# Patient Record
Sex: Female | Born: 2000
Health system: Southern US, Community
[De-identification: ages and names within clinical notes are randomized; demographics above are authoritative.]

## PROBLEM LIST (undated history)

## (undated) DIAGNOSIS — I471 Supraventricular tachycardia, unspecified: Secondary | ICD-10-CM

## (undated) DIAGNOSIS — F32A Depression, unspecified: Secondary | ICD-10-CM

## (undated) DIAGNOSIS — M722 Plantar fascial fibromatosis: Secondary | ICD-10-CM

## (undated) DIAGNOSIS — I498 Other specified cardiac arrhythmias: Secondary | ICD-10-CM

## (undated) DIAGNOSIS — G90A Postural orthostatic tachycardia syndrome (POTS): Secondary | ICD-10-CM

## (undated) DIAGNOSIS — M2550 Pain in unspecified joint: Secondary | ICD-10-CM

## (undated) DIAGNOSIS — L503 Dermatographic urticaria: Secondary | ICD-10-CM

## (undated) DIAGNOSIS — J302 Other seasonal allergic rhinitis: Secondary | ICD-10-CM

## (undated) DIAGNOSIS — F419 Anxiety disorder, unspecified: Secondary | ICD-10-CM

## (undated) HISTORY — DX: Dermatographic urticaria: L50.3

## (undated) HISTORY — DX: Depression, unspecified: F32.A

## (undated) HISTORY — DX: Postural orthostatic tachycardia syndrome (POTS): G90.A

## (undated) HISTORY — DX: Supraventricular tachycardia, unspecified: I47.10

## (undated) HISTORY — DX: Supraventricular tachycardia: I47.1

## (undated) HISTORY — DX: Pain in unspecified joint: M25.50

## (undated) HISTORY — PX: WISDOM TOOTH EXTRACTION: SHX21

## (undated) HISTORY — DX: Other specified cardiac arrhythmias: I49.8

## (undated) HISTORY — DX: Anxiety disorder, unspecified: F41.9

---
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
## 2019-01-04 MED FILL — MARLISSA-28 TABLET: 0.15-30 | 63 days supply | Qty: 84 | Fill #1

## 2019-01-04 MED FILL — MOMETASONE FUROATE 50 MCG S: 50 | 60 days supply | Qty: 17 | Fill #1

## 2019-01-05 DIAGNOSIS — Z6825 Body mass index (BMI) 25.0-25.9, adult: Secondary | ICD-10-CM | POA: Diagnosis not present

## 2019-01-05 DIAGNOSIS — N944 Primary dysmenorrhea: Secondary | ICD-10-CM | POA: Diagnosis not present

## 2019-01-05 DIAGNOSIS — Z130 Encounter for screening for diseases of the blood and blood-forming organs and certain disorders involving the immune mechanism: Secondary | ICD-10-CM | POA: Diagnosis not present

## 2019-01-05 DIAGNOSIS — Z01419 Encounter for gynecological examination (general) (routine) without abnormal findings: Secondary | ICD-10-CM | POA: Diagnosis not present

## 2019-01-11 ENCOUNTER — Encounter: Payer: Self-pay | Admitting: Psychiatry

## 2019-01-11 ENCOUNTER — Ambulatory Visit (INDEPENDENT_AMBULATORY_CARE_PROVIDER_SITE_OTHER): Payer: 59 | Admitting: Psychiatry

## 2019-01-11 VITALS — Ht 66.5 in | Wt 154.0 lb

## 2019-01-11 DIAGNOSIS — F900 Attention-deficit hyperactivity disorder, predominantly inattentive type: Secondary | ICD-10-CM

## 2019-01-11 DIAGNOSIS — F411 Generalized anxiety disorder: Secondary | ICD-10-CM

## 2019-01-11 DIAGNOSIS — F812 Mathematics disorder: Secondary | ICD-10-CM | POA: Diagnosis not present

## 2019-01-11 DIAGNOSIS — F341 Dysthymic disorder: Secondary | ICD-10-CM | POA: Diagnosis not present

## 2019-01-11 MED ORDER — ESCITALOPRAM OXALATE 10 MG PO TABS: 10.0000 mg | ORAL_TABLET | Freq: Every day | ORAL | 1 refills | Status: DC

## 2019-01-11 MED ORDER — BUPROPION HCL ER (XL) 150 MG PO TB24: 150.0000 mg | ORAL_TABLET | Freq: Every day | ORAL | 1 refills | Status: DC

## 2019-01-11 MED FILL — ESCITALOPRAM 10 MG TABLET: 10 | 90 days supply | Qty: 90 | Fill #0

## 2019-01-11 NOTE — Progress Notes (Signed)
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  She did well at [employer] first semester losing 2 pounds making a few good friends but many acquaintances, though not being particularly social.  She states the academics were hard but she likely did okay with grades still pending from finals and papers.  She takes Lexapro 10 mg every night but forgets Wellbutrin 150 mg XL some mornings without adverse effects.  She is less consistent with her Adderall 20 mg XR and Adderall 5 mg IR, not taking them at all on her break from college.  [place] registry documents last Adderall IR on 11/03/2018 at XR on 09/25/2018.  She could not receive her allergy shots at college.  She is driving adequately on return home even off the Adderall.  She has no mania, suicidality, psychosis or delirium.  Depression  The patient presents withdepression as a chronicproblem.The current episode started more than 2 years ago by 12/31/2016. The onset quality was gradual. The problem occurs every few days.The problem has been gradually improvingsince onset.Associated symptoms include decreased concentration, social withdrawal,and decreased interest. Associated symptoms include no helplessness,no hopelessness,not irritable,no restlessness,no appetite change,no myalgias,no headaches,not sad, nofatigue, noinsomniaand no suicidal ideas.The symptoms are aggravated by work stress, medication,  social issues and family issues.Past treatments include SSRIs - Selective serotonin reuptake inhibitors, other medications and psychotherapy.Compliance with treatment is good.Past compliance problems include difficulty with treatment plan and medication issues.Previous treatment provided moderaterelief.Risk factors include a change in medication usage/dosage, family history of mental illness, family history, major life event and stress. Past medical history includes anxiety,depressionand mental health disorder. Pertinent negatives include no chronic pain,no thyroid problem,no physical disability,no recent psychiatric admission,no bipolar disorder,no eating disorder,no obsessive-compulsive disorder,no post-traumatic stress disorder,no schizophrenia,no suicide attemptsand no head trauma.  Individual Medical History/ Review of Systems: Changes? :Yes with last allergy visit registered 08/14/2018 likely for HST, also off Singulair since then though she continues BCP for dysmenorrhea and likely spironolactone for acne.  Allergies: Patient has no known allergies.  Current Medications:  Current Outpatient Medications:  .  ADDERALL XR 20 MG 24 hr capsule, TAKE 1 CAPSULE BY MOUTH DAILY AFTER BREAKFAST., Disp: 90 capsule, Rfl: 0 .  amphetamine-dextroamphetamine (ADDERALL) 5 MG tablet, Take 1 tablet (5 mg total) by mouth 2 (two) times daily with a meal., Disp: 180 tablet, Rfl: 0 .  buPROPion (WELLBUTRIN XL) 150 MG 24 hr tablet, Take 1 tablet (150 mg total) by mouth daily after breakfast., Disp: 90 tablet, Rfl: 1 .  cetirizine (ZYRTEC) 5 MG chewable tablet, Chew 5 mg by mouth daily., Disp: , Rfl:  .  escitalopram (LEXAPRO) 10 MG tablet, Take 1 tablet (10 mg total) by mouth at bedtime., Disp: 90 tablet, Rfl: 1 .  montelukast (SINGULAIR) 4 MG chewable tablet, Chew 4 mg by mouth at bedtime., Disp: , Rfl:  .  predniSONE (STERAPRED UNI-PAK 21 TAB) 5 MG (21) TBPK tablet, 6tabs in am on tues,  5tabs in am on wed , 4 on thurs , 3 on fri, 2 tabs on  sat and 1 on sun., Disp: 21 tablet, Rfl: 1   Medication Side Effects: none  Family Medical/ Social History: Changes? No  MENTAL HEALTH EXAM:  Height 5' 6.5" (1.689 m), weight 154 lb (69.9 kg).Body mass index is 24.48 kg/m.  As not present here today  General Appearance: N/A  Eye Contact:  N/A  Speech:  Clear and Coherent, Normal Rate and Talkative  Volume:  Decreased  Mood:  Anxious, Dysphoric and Euthymic  Affect:  Congruent, Depressed, Inappropriate, Full Range and Anxious  Thought Process:  Coherent, Goal Directed, Irrelevant, Linear and Descriptions of Associations: Tangential  Orientation:  Full (Time, Place, and Person)  Thought Content: Rumination and Tangential   Suicidal Thoughts:  No  Homicidal Thoughts:  No  Memory:  Immediate;   Good Remote;   Good  Judgement:  Good  Insight:  Fair  Psychomotor Activity:  Normal and Mannerisms  Concentration:  Concentration: Fair and Attention Span: Fair  Recall:  Good  Fund of Knowledge: Good  Language: Good  Assets:  Leisure Time Resilience Vocational/Educational  ADL's:  Intact  Cognition: WNL  Prognosis:  Good    DIAGNOSES:    ICD-10-CM   1. Generalized anxiety disorder  F41.1 buPROPion (WELLBUTRIN XL) 150 MG 24 hr tablet    escitalopram (LEXAPRO) 10 MG tablet  2. Attention deficit hyperactivity disorder (ADHD), predominantly inattentive type  F90.0 buPROPion (WELLBUTRIN XL) 150 MG 24 hr tablet  3. Persistent depressive disorder with atypical features, currently mild  F34.1 escitalopram (LEXAPRO) 10 MG tablet  4. 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  Symptom treatment matching for medications can be simultaneously reviewed for more consistency with Wellbutrin like Lexapro with the as needed element of treatment to be the Adderall XR and IR.  She states mother sends her medications from [facility] needing eScription updates locally from mother.  She is E scribed Wellbutrin 150 mg XL every morning sent as #90 with 1 refill to [facility] outpatient pharmacy for anxiety, depression, and ADHD.  Lexapro is E scribed 10 mg every bedtime sent as #90 with 1 refill for anxiety and depression to [facility] outpatient pharmacy.  She has current supply of Adderall 20 mg XR every morning and 5 mg IR twice daily as directed receiving 90-day supplies to [facility] outpatient though having adequate supply currently to have mother or pharmacy notify office when next supply needed and return here in 6 months for follow-up.  Virtual Visit via Video Note  I connected with Kaesha Kirsch Worley on 01/11/19 at  1:00 PM EST by a video enabled telemedicine application and verified that I am speaking with the correct person using two identifiers.  Location: Patient: Individually at parents' residence by audio and video camera Provider: [facility]   I discussed the limitations of evaluation and management by telemedicine and the availability of in person appointments. The patient expressed understanding and agreed to proceed.  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  She  did well at [employer] first semester losing 2 pounds making a few good friends but many acquaintances, though not being particularly  social.  She states the academics were hard but she likely did okay with grades   Observations/Objective: Mood:  Anxious, Dysphoric and Euthymic  Affect:  Congruent, Depressed, Inappropriate, Full Range and Anxious  Thought Process:  Coherent, Goal Directed, Irrelevant, Linear and Descriptions of Associations: Tangential  Orientation:  Full (Time, Place, and Person)  Thought Content: Rumination and Tangential    Assessment and Plan:  Psychosupportive psychoeducation updates strengths from adaptations at [place]state college with paramount social and activity goals for next semester as successful structure is being established to be best implemented with ongoing encouragement and support such as she received from mother in high school, addressing social skills, frustration management, and solution focused opportunities.  Symptom treatment matching for medications can be simultaneously reviewed for more consistency with Wellbutrin like Lexapro with the as needed element of treatment to be the Adderall XR and IR.  She states [facility] needing eScription updates locally from mother.  She is E scribed Wellbutrin 150 mg XL every morning sent as #90 with 1 refill to [facility] outpatient pharmacy for anxiety, depression, and ADHD.  Lexapro is E scribed 10 mg every bedtime sent as #90 with 1 refill for anxiety and depression to [facility] pharmacy.  She has current supply of Adderall 20 mg XR every morning and 5 mg IR twice daily as directed   Follow Up Instructions: To have mother or pharmacy notify office when next supply needed and return here in 6 months for follow-up.   I discussed the assessment and treatment plan with the patient. The patient was provided an opportunity to ask questions and all were answered. The patient agreed with the plan and demonstrated an understanding of the instructions.   The patient was advised to call back or seek an in-person  evaluation if the symptoms worsen or if the condition fails to improve as anticipated.  I provided 15 minutes of non-face-to-face time during this encounter. [employer] meeting #[phone] Meeting password:  [email or web address]  Chauncey MannGlenn E Joylyn Duggin, MD  Chauncey MannGlenn E Damyan Corne, MD

## 2019-01-28 ENCOUNTER — Other Ambulatory Visit: Payer: Self-pay | Admitting: Psychiatry

## 2019-01-28 DIAGNOSIS — F900 Attention-deficit hyperactivity disorder, predominantly inattentive type: Secondary | ICD-10-CM

## 2019-01-28 NOTE — Telephone Encounter (Signed)
Next apt 06/17/2019 

## 2019-01-28 NOTE — Telephone Encounter (Signed)
Recent appointment confirmed necessity medically with no contraindication sending Adderall 20 mg XR every morning #90 with no refill to [facility] outpatient pharmacy as patient goes to [employer]

## 2019-01-29 MED FILL — ADDERALL XR 20 MG CAP SA: 20 | 90 days supply | Qty: 90 | Fill #0

## 2019-02-01 DIAGNOSIS — R519 Headache, unspecified: Secondary | ICD-10-CM | POA: Diagnosis not present

## 2019-02-01 DIAGNOSIS — Z049 Encounter for examination and observation for unspecified reason: Secondary | ICD-10-CM | POA: Diagnosis not present

## 2019-02-01 DIAGNOSIS — Z79899 Other long term (current) drug therapy: Secondary | ICD-10-CM | POA: Diagnosis not present

## 2019-02-01 DIAGNOSIS — G43719 Chronic migraine without aura, intractable, without status migrainosus: Secondary | ICD-10-CM | POA: Diagnosis not present

## 2019-02-01 MED FILL — BACLOFEN 10 MG TABS: 10 | 30 days supply | Qty: 20 | Fill #0

## 2019-02-09 DIAGNOSIS — G43719 Chronic migraine without aura, intractable, without status migrainosus: Secondary | ICD-10-CM | POA: Diagnosis not present

## 2019-02-09 MED FILL — ZONISAMIDE 25 MG CAPSULE: 25 | 30 days supply | Qty: 120 | Fill #0

## 2019-02-16 DIAGNOSIS — M542 Cervicalgia: Secondary | ICD-10-CM | POA: Diagnosis not present

## 2019-02-16 DIAGNOSIS — G43719 Chronic migraine without aura, intractable, without status migrainosus: Secondary | ICD-10-CM | POA: Diagnosis not present

## 2019-02-16 DIAGNOSIS — M791 Myalgia, unspecified site: Secondary | ICD-10-CM | POA: Diagnosis not present

## 2019-02-23 MED FILL — buPROPion HCL ER (XL) 150 M: 150 | 90 days supply | Qty: 90 | Fill #0

## 2019-03-02 DIAGNOSIS — G43719 Chronic migraine without aura, intractable, without status migrainosus: Secondary | ICD-10-CM | POA: Diagnosis not present

## 2019-03-02 DIAGNOSIS — M791 Myalgia, unspecified site: Secondary | ICD-10-CM | POA: Diagnosis not present

## 2019-03-02 DIAGNOSIS — M542 Cervicalgia: Secondary | ICD-10-CM | POA: Diagnosis not present

## 2019-03-04 MED FILL — buPROPion HCL ER (XL) 150 M: 150 | 90 days supply | Qty: 90 | Fill #0 | Status: TO

## 2019-03-04 MED FILL — buPROPion HCL ER (XL) 150 M: 150 | 90 days supply | Qty: 90 | Fill #0

## 2019-03-11 MED FILL — MARLISSA-28 TABLET: 0.15-30 | 63 days supply | Qty: 84 | Fill #0

## 2019-03-17 DIAGNOSIS — G43719 Chronic migraine without aura, intractable, without status migrainosus: Secondary | ICD-10-CM | POA: Diagnosis not present

## 2019-03-17 DIAGNOSIS — M542 Cervicalgia: Secondary | ICD-10-CM | POA: Diagnosis not present

## 2019-03-17 DIAGNOSIS — M791 Myalgia, unspecified site: Secondary | ICD-10-CM | POA: Diagnosis not present

## 2019-03-31 MED FILL — ZONISAMIDE 25 MG CAPSULE: 25 | 30 days supply | Qty: 120 | Fill #1

## 2019-04-06 DIAGNOSIS — G43719 Chronic migraine without aura, intractable, without status migrainosus: Secondary | ICD-10-CM | POA: Diagnosis not present

## 2019-04-06 DIAGNOSIS — M791 Myalgia, unspecified site: Secondary | ICD-10-CM | POA: Diagnosis not present

## 2019-04-06 DIAGNOSIS — M542 Cervicalgia: Secondary | ICD-10-CM | POA: Diagnosis not present

## 2019-04-15 ENCOUNTER — Ambulatory Visit: Payer: 59 | Attending: Internal Medicine

## 2019-04-15 DIAGNOSIS — Z23 Encounter for immunization: Secondary | ICD-10-CM

## 2019-04-15 NOTE — Progress Notes (Signed)
   Covid-19 Vaccination Clinic  Name:  GILDA ABBOUD    MRN: [id number] DOB: 2000-11-29  04/15/2019  Ms. Suchy was observed post Covid-19 immunization for 15 minutes without incident. She was provided with Vaccine Information Sheet and instruction to access the V-Safe system.   Ms. Cohill was instructed to call 911 with any severe reactions post vaccine: Marland Kitchen Difficulty breathing  . Swelling of face and throat  . A fast heartbeat  . A bad rash all over body  . Dizziness and weakness   Immunizations Administered    Name Date Dose VIS Date Route   Pfizer COVID-19 Vaccine 04/15/2019  1:00 PM 0.3 mL 01/01/2019 Intramuscular   Manufacturer: [employer], [employer]   Lot: [id number]   NDC: [address]-6

## 2019-04-26 MED FILL — ESCITALOPRAM 10 MG TABLET: 10 | 90 days supply | Qty: 90 | Fill #1

## 2019-04-29 DIAGNOSIS — M542 Cervicalgia: Secondary | ICD-10-CM | POA: Diagnosis not present

## 2019-04-29 DIAGNOSIS — G43719 Chronic migraine without aura, intractable, without status migrainosus: Secondary | ICD-10-CM | POA: Diagnosis not present

## 2019-04-29 DIAGNOSIS — M791 Myalgia, unspecified site: Secondary | ICD-10-CM | POA: Diagnosis not present

## 2019-05-10 MED FILL — ZONISAMIDE 25 MG CAPSULE: 25 | 30 days supply | Qty: 120 | Fill #0

## 2019-05-11 ENCOUNTER — Ambulatory Visit: Payer: 59 | Attending: Internal Medicine

## 2019-05-11 DIAGNOSIS — Z23 Encounter for immunization: Secondary | ICD-10-CM

## 2019-05-11 NOTE — Progress Notes (Signed)
   Covid-19 Vaccination Clinic  Name:  Rachel Little    MRN: [id number] DOB: Jun 25, 2000  05/11/2019  Rachel Little was observed post Covid-19 immunization for 15 minutes without incident. She was provided with Vaccine Information Sheet and instruction to access the V-Safe system.   Rachel Little was instructed to call 911 with any severe reactions post vaccine: Marland Kitchen Difficulty breathing  . Swelling of face and throat  . A fast heartbeat  . A bad rash all over body  . Dizziness and weakness   Immunizations Administered    Name Date Dose VIS Date Route   Pfizer COVID-19 Vaccine 05/11/2019 10:24 AM 0.3 mL 03/17/2018 Intramuscular   Manufacturer: [employer], [employer]   Lot: [id number]   NDC: [address]-1

## 2019-05-19 MED FILL — MARLISSA-28 TABLET: 0.15-30 | 63 days supply | Qty: 84 | Fill #1

## 2019-05-20 DIAGNOSIS — M542 Cervicalgia: Secondary | ICD-10-CM | POA: Diagnosis not present

## 2019-05-20 DIAGNOSIS — G43719 Chronic migraine without aura, intractable, without status migrainosus: Secondary | ICD-10-CM | POA: Diagnosis not present

## 2019-05-20 DIAGNOSIS — M791 Myalgia, unspecified site: Secondary | ICD-10-CM | POA: Diagnosis not present

## 2019-05-28 MED FILL — ZONISAMIDE 50 MG CAPSULE: 50 | 30 days supply | Qty: 90 | Fill #0

## 2019-06-12 DIAGNOSIS — F411 Generalized anxiety disorder: Secondary | ICD-10-CM | POA: Diagnosis not present

## 2019-06-12 DIAGNOSIS — F331 Major depressive disorder, recurrent, moderate: Secondary | ICD-10-CM | POA: Diagnosis not present

## 2019-06-12 DIAGNOSIS — F902 Attention-deficit hyperactivity disorder, combined type: Secondary | ICD-10-CM | POA: Diagnosis not present

## 2019-06-14 MED FILL — DESVENLAFAXINE SUC ER 25 MG: 25 | 90 days supply | Qty: 90 | Fill #0

## 2019-06-17 ENCOUNTER — Ambulatory Visit: Payer: 59 | Admitting: Psychiatry

## 2019-06-19 ENCOUNTER — Other Ambulatory Visit: Payer: Self-pay

## 2019-06-19 ENCOUNTER — Encounter (HOSPITAL_COMMUNITY): Payer: Self-pay | Admitting: Emergency Medicine

## 2019-06-19 ENCOUNTER — Ambulatory Visit (HOSPITAL_COMMUNITY)
Admission: EM | Admit: 2019-06-19 | Discharge: 2019-06-19 | Disposition: A | Discharge status: Home / Self Care | Payer: 59 | Attending: Emergency Medicine | Admitting: Emergency Medicine

## 2019-06-19 DIAGNOSIS — Z3202 Encounter for pregnancy test, result negative: Secondary | ICD-10-CM | POA: Diagnosis not present

## 2019-06-19 DIAGNOSIS — M545 Low back pain, unspecified: Secondary | ICD-10-CM

## 2019-06-19 DIAGNOSIS — N390 Urinary tract infection, site not specified: Secondary | ICD-10-CM | POA: Diagnosis not present

## 2019-06-19 LAB — POCT URINALYSIS DIP (DEVICE)
Bilirubin Urine: NEGATIVE
Glucose, UA: NEGATIVE mg/dL
Hgb urine dipstick: NEGATIVE
Ketones, ur: NEGATIVE mg/dL
Nitrite: NEGATIVE
Protein, ur: NEGATIVE mg/dL
Specific Gravity, Urine: 1.015 (ref 1.005–1.030)
Urobilinogen, UA: 0.2 mg/dL (ref 0.0–1.0)
pH: 6.5 (ref 5.0–8.0)

## 2019-06-19 LAB — POC URINE PREG, ED: Preg Test, Ur: NEGATIVE

## 2019-06-19 MED ORDER — NAPROXEN 500 MG PO TABS: 500.0000 mg | ORAL_TABLET | Freq: Two times a day (BID) | ORAL | 0 refills | Status: DC

## 2019-06-19 NOTE — ED Triage Notes (Signed)
Pt c/o uti sx including lower back pain and urinary incontence this morning when she woke up. Pt denies any vaginal discharge or vaginal bleeding. Pt denies painful urination.

## 2019-06-19 NOTE — Discharge Instructions (Addendum)
Continue to drink plenty of fluids Urine culture pending, initial test did not shhow infection/UTI Vaginal swab pending to screen for any infection causing odor Use naprosyn twice daily as needed for pain

## 2019-06-20 DIAGNOSIS — F4011 Social phobia, generalized: Secondary | ICD-10-CM | POA: Diagnosis not present

## 2019-06-20 DIAGNOSIS — F331 Major depressive disorder, recurrent, moderate: Secondary | ICD-10-CM | POA: Diagnosis not present

## 2019-06-20 LAB — URINE CULTURE: Culture: NO GROWTH

## 2019-06-20 NOTE — ED Provider Notes (Signed)
[facility]    CSN: [id number] Arrival date & time: 06/19/19  1616      History   Chief Complaint Chief Complaint  Patient presents with  . Urinary Tract Infection    HPI Rachel Little is a 19 y.o. female presenting today for evaluation of lower back pain.  Patient reports that over the past few weeks she has noticed that she has had foul-smelling urine.  In the past day she has developed pain in her lower back and side.  Reports last night she had an episode of urinary incontinence, but this has not persisted throughout the day.  She denies numbness or tingling.  She denies any dysuria, increased frequency or urgency.  She denies any vaginal discharge.  Denies being sexually active.  Denies any injury fall or increase in activity.  HPI  Past Medical History:  Diagnosis Date  . Seasonal allergic rhinitis     Patient Active Problem List   Diagnosis Date Noted  . Generalized anxiety disorder 10/21/2017  . Attention deficit hyperactivity disorder (ADHD), predominantly inattentive type 10/21/2017  . Persistent depressive disorder with atypical features, currently mild 10/21/2017  . 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 No pertinent surgical history.  OB History   No obstetric history on file.      Home Medications    Prior to Admission medications   Medication Sig Start Date End Date Taking? Authorizing Provider  ADDERALL XR 20 MG 24 hr capsule TAKE 1 CAPSULE BY MOUTH DAILY AFTER BREAKFAST. 01/28/19  Yes Chauncey Mann, MD  baclofen (LIORESAL) 10 MG tablet Take 10 mg by mouth as needed. 02/01/19  Yes [provider]  desvenlafaxine (PRISTIQ) 50 MG 24 hr tablet Take 50 mg by mouth daily.   Yes [provider]  MARLISSA 0.15-30 MG-MCG tablet Take 1 tablet by mouth daily. 05/19/19  Yes [provider]  spironolactone (ALDACTONE) 50 MG tablet Take 50 mg by mouth daily.   Yes  [provider]  montelukast (SINGULAIR) 4 MG chewable tablet Chew 4 mg by mouth at bedtime.  06/19/19 Yes [provider]  zonisamide (ZONEGRAN) 50 MG capsule Take 150 mg by mouth daily. 05/28/19 06/19/19 Yes [provider]  naproxen (NAPROSYN) 500 MG tablet Take 1 tablet (500 mg total) by mouth 2 (two) times daily. 06/19/19   Wieters, Hallie C, PA-C  buPROPion (WELLBUTRIN XL) 150 MG 24 hr tablet Take 1 tablet (150 mg total) by mouth daily after breakfast. 01/11/19 06/19/19  Chauncey Mann, MD  cetirizine (ZYRTEC) 5 MG chewable tablet Chew 5 mg by mouth daily.  06/19/19  [provider]  escitalopram (LEXAPRO) 10 MG tablet Take 1 tablet (10 mg total) by mouth at bedtime. 01/11/19 06/19/19  Chauncey Mann, MD    Family History No family history on file.  Social History Social History   Tobacco Use  . Smoking status: Never Smoker  . Smokeless tobacco: Never Used  Substance Use Topics  . Alcohol use: Not on file  . Drug use: Not on file     Allergies   Patient has no known allergies.   Review of Systems Review of Systems  Constitutional: Negative for fever.  Respiratory: Negative for shortness of breath.   Cardiovascular: Negative for chest pain.  Gastrointestinal: Negative for abdominal pain, diarrhea, nausea and vomiting.  Genitourinary: Positive for flank pain. Negative for dysuria, genital sores, hematuria, menstrual problem, vaginal bleeding, vaginal discharge and  vaginal pain.  Musculoskeletal: Positive for back pain.  Skin: Negative for rash.  Neurological: Negative for dizziness, light-headedness and headaches.     Physical Exam Triage Vital Signs ED Triage Vitals  Enc Vitals Group     BP 06/19/19 1639 128/76     Pulse Rate 06/19/19 1639 91     Resp 06/19/19 1639 16     Temp 06/19/19 1639 97.6 F (36.4 C)     Temp Source 06/19/19 1639 Oral     SpO2 06/19/19 1639 98 %     Weight --      Height --      Head Circumference --       Peak Flow --      Pain Score 06/19/19 1641 0     Pain Loc --      Pain Edu? --      Excl. in National? --    No data found.  Updated Vital Signs BP 128/76 (BP Location: Right Arm)   Pulse 91   Temp 97.6 F (36.4 C) (Oral)   Resp 16   SpO2 98%   Visual Acuity Right Eye Distance:   Left Eye Distance:   Bilateral Distance:    Right Eye Near:   Left Eye Near:    Bilateral Near:     Physical Exam Vitals and nursing note reviewed.  Constitutional:      Appearance: She is well-developed.     Comments: No acute distress  HENT:     Head: Normocephalic and atraumatic.     Nose: Nose normal.  Eyes:     Conjunctiva/sclera: Conjunctivae normal.  Cardiovascular:     Rate and Rhythm: Normal rate.  Pulmonary:     Effort: Pulmonary effort is normal. No respiratory distress.  Abdominal:     General: There is no distension.     Comments: Soft, nondistended, nontender to light and deep palpation throughout lower abdomen, mild tenderness to epigastrium.  Musculoskeletal:        General: Normal range of motion.     Cervical back: Neck supple.     Comments: Back: Nontender to palpation lumbar spine midline or bilateral lumbar musculature, weakly positive CVA tenderness on the left  Skin:    General: Skin is warm and dry.  Neurological:     Mental Status: She is alert and oriented to person, place, and time.      UC Treatments / Results  Labs (all labs ordered are listed, but only abnormal results are displayed) Labs Reviewed  POCT URINALYSIS DIP (DEVICE) - Abnormal; Notable for the following components:      Result Value   Leukocytes,Ua TRACE (*)    All other components within normal limits  URINE CULTURE  POC URINE PREG, ED  CERVICOVAGINAL ANCILLARY ONLY    EKG   Radiology No results found.  Procedures Procedures (including critical care time)  Medications Ordered in UC Medications - No data to display  Initial Impression / Assessment and Plan / UC Course  I  have reviewed the triage vital signs and the nursing notes.  Pertinent labs & imaging results that were available during my care of the patient were reviewed by me and considered in my medical decision making (see chart for details).     UA with trace leuks, will send for culture.  At this time less suggestive of UTI, discussed obtaining swab to screen for yeast/BV for possible cause of odor.  Less suspicious of STDs.  At this time  treating symptomatically and supportively, using Naprosyn as needed for pain with close monitoring.  Discussed strict return precautions. Patient verbalized understanding and is agreeable with plan.  Final Clinical Impressions(s) / UC Diagnoses   Final diagnoses:  Acute left-sided low back pain without sciatica     Discharge Instructions     Continue to drink plenty of fluids Urine culture pending, initial test did not shhow infection/UTI Vaginal swab pending to screen for any infection causing odor Use naprosyn twice daily as needed for pain     ED Prescriptions    Medication Sig Dispense Auth. Provider   naproxen (NAPROSYN) 500 MG tablet Take 1 tablet (500 mg total) by mouth 2 (two) times daily. 30 tablet Wieters, [place] C, PA-C     PDMP not reviewed this encounter.   Lew Dawes, PA-C 06/20/19 1042

## 2019-06-22 MED FILL — NAPROXEN 500 MG TABLET: 500 | 15 days supply | Qty: 30 | Fill #0

## 2019-06-23 LAB — CERVICOVAGINAL ANCILLARY ONLY
Bacterial Vaginitis (gardnerella): NEGATIVE
Candida Glabrata: NEGATIVE
Candida Vaginitis: NEGATIVE
Chlamydia: NEGATIVE
Comment: NEGATIVE
Comment: NEGATIVE
Comment: NEGATIVE
Comment: NEGATIVE
Comment: NEGATIVE
Comment: NORMAL
Neisseria Gonorrhea: NEGATIVE
Trichomonas: NEGATIVE

## 2019-07-01 DIAGNOSIS — G43719 Chronic migraine without aura, intractable, without status migrainosus: Secondary | ICD-10-CM | POA: Diagnosis not present

## 2019-07-05 DIAGNOSIS — F411 Generalized anxiety disorder: Secondary | ICD-10-CM | POA: Diagnosis not present

## 2019-07-12 DIAGNOSIS — F4011 Social phobia, generalized: Secondary | ICD-10-CM | POA: Diagnosis not present

## 2019-07-12 DIAGNOSIS — F331 Major depressive disorder, recurrent, moderate: Secondary | ICD-10-CM | POA: Diagnosis not present

## 2019-07-12 MED FILL — DESVENLAFAXINE SUC ER 100 M: 100 | 90 days supply | Qty: 90 | Fill #0

## 2019-07-14 MED FILL — MARLISSA-28 TABLET: 0.15-30 | 84 days supply | Qty: 112 | Fill #2

## 2019-07-16 MED FILL — SPIRONOLACTONE 50 MG TABS: 50 | 90 days supply | Qty: 180 | Fill #0

## 2019-08-02 MED FILL — BACLOFEN 10 MG TABS: 10 | 30 days supply | Qty: 20 | Fill #1

## 2019-08-02 MED FILL — ADDERALL XR 20 MG CAP SA: 20 | 90 days supply | Qty: 90 | Fill #0

## 2019-08-02 MED FILL — ZONISAMIDE 100 MG CAPSULE: 100 | 30 days supply | Qty: 60 | Fill #1

## 2019-08-14 DIAGNOSIS — F4011 Social phobia, generalized: Secondary | ICD-10-CM | POA: Diagnosis not present

## 2019-08-14 DIAGNOSIS — F331 Major depressive disorder, recurrent, moderate: Secondary | ICD-10-CM | POA: Diagnosis not present

## 2019-08-27 MED FILL — ZONISAMIDE 100 MG CAPSULE: 100 | 30 days supply | Qty: 60 | Fill #2

## 2019-09-17 DIAGNOSIS — Z20822 Contact with and (suspected) exposure to covid-19: Secondary | ICD-10-CM | POA: Diagnosis not present

## 2019-09-17 DIAGNOSIS — G510 Bell's palsy: Secondary | ICD-10-CM | POA: Diagnosis not present

## 2019-09-17 DIAGNOSIS — Z03818 Encounter for observation for suspected exposure to other biological agents ruled out: Secondary | ICD-10-CM | POA: Diagnosis not present

## 2019-09-30 DIAGNOSIS — N946 Dysmenorrhea, unspecified: Secondary | ICD-10-CM | POA: Diagnosis not present

## 2019-09-30 DIAGNOSIS — F329 Major depressive disorder, single episode, unspecified: Secondary | ICD-10-CM | POA: Diagnosis not present

## 2019-09-30 DIAGNOSIS — Z23 Encounter for immunization: Secondary | ICD-10-CM | POA: Diagnosis not present

## 2019-09-30 DIAGNOSIS — M6281 Muscle weakness (generalized): Secondary | ICD-10-CM | POA: Diagnosis not present

## 2019-09-30 DIAGNOSIS — G510 Bell's palsy: Secondary | ICD-10-CM | POA: Diagnosis not present

## 2019-09-30 DIAGNOSIS — G43909 Migraine, unspecified, not intractable, without status migrainosus: Secondary | ICD-10-CM | POA: Diagnosis not present

## 2019-09-30 DIAGNOSIS — Z793 Long term (current) use of hormonal contraceptives: Secondary | ICD-10-CM | POA: Diagnosis not present

## 2019-09-30 DIAGNOSIS — R6889 Other general symptoms and signs: Secondary | ICD-10-CM | POA: Diagnosis not present

## 2019-09-30 DIAGNOSIS — H04123 Dry eye syndrome of bilateral lacrimal glands: Secondary | ICD-10-CM | POA: Diagnosis not present

## 2019-09-30 DIAGNOSIS — H6122 Impacted cerumen, left ear: Secondary | ICD-10-CM | POA: Diagnosis not present

## 2019-09-30 DIAGNOSIS — R5383 Other fatigue: Secondary | ICD-10-CM | POA: Diagnosis not present

## 2019-10-01 DIAGNOSIS — Z0000 Encounter for general adult medical examination without abnormal findings: Secondary | ICD-10-CM | POA: Diagnosis not present

## 2019-10-04 MED FILL — DESVENLAFAXINE SUC ER 100 M: 100 | 90 days supply | Qty: 90 | Fill #0

## 2019-10-09 DIAGNOSIS — F331 Major depressive disorder, recurrent, moderate: Secondary | ICD-10-CM | POA: Diagnosis not present

## 2019-10-09 DIAGNOSIS — F4011 Social phobia, generalized: Secondary | ICD-10-CM | POA: Diagnosis not present

## 2019-10-12 DIAGNOSIS — Z0000 Encounter for general adult medical examination without abnormal findings: Secondary | ICD-10-CM | POA: Diagnosis not present

## 2019-10-12 DIAGNOSIS — H04123 Dry eye syndrome of bilateral lacrimal glands: Secondary | ICD-10-CM | POA: Diagnosis not present

## 2019-10-12 DIAGNOSIS — H5213 Myopia, bilateral: Secondary | ICD-10-CM | POA: Diagnosis not present

## 2019-10-12 DIAGNOSIS — R7309 Other abnormal glucose: Secondary | ICD-10-CM | POA: Diagnosis not present

## 2019-10-12 MED FILL — SPIRONOLACTONE 50 MG TABS: 50 | 90 days supply | Qty: 180 | Fill #1

## 2019-10-12 MED FILL — MARLISSA-28 TABLET: 0.15-30 | 84 days supply | Qty: 112 | Fill #3

## 2019-10-15 DIAGNOSIS — G43909 Migraine, unspecified, not intractable, without status migrainosus: Secondary | ICD-10-CM | POA: Diagnosis not present

## 2019-10-20 DIAGNOSIS — R5383 Other fatigue: Secondary | ICD-10-CM | POA: Diagnosis not present

## 2019-10-20 DIAGNOSIS — M6281 Muscle weakness (generalized): Secondary | ICD-10-CM | POA: Diagnosis not present

## 2019-10-20 DIAGNOSIS — F411 Generalized anxiety disorder: Secondary | ICD-10-CM | POA: Diagnosis not present

## 2019-10-20 DIAGNOSIS — Z8261 Family history of arthritis: Secondary | ICD-10-CM | POA: Diagnosis not present

## 2019-10-20 DIAGNOSIS — Z820 Family history of epilepsy and other diseases of the nervous system: Secondary | ICD-10-CM | POA: Diagnosis not present

## 2019-10-20 DIAGNOSIS — R768 Other specified abnormal immunological findings in serum: Secondary | ICD-10-CM | POA: Diagnosis not present

## 2019-10-20 DIAGNOSIS — G43909 Migraine, unspecified, not intractable, without status migrainosus: Secondary | ICD-10-CM | POA: Diagnosis not present

## 2019-10-20 DIAGNOSIS — F900 Attention-deficit hyperactivity disorder, predominantly inattentive type: Secondary | ICD-10-CM | POA: Diagnosis not present

## 2019-10-20 DIAGNOSIS — F329 Major depressive disorder, single episode, unspecified: Secondary | ICD-10-CM | POA: Diagnosis not present

## 2019-10-28 ENCOUNTER — Other Ambulatory Visit (HOSPITAL_COMMUNITY): Payer: Self-pay | Admitting: Specialist

## 2019-10-28 ENCOUNTER — Institutional Professional Consult (permissible substitution): Payer: 59 | Admitting: Internal Medicine

## 2019-10-28 DIAGNOSIS — G43719 Chronic migraine without aura, intractable, without status migrainosus: Secondary | ICD-10-CM | POA: Diagnosis not present

## 2019-10-29 ENCOUNTER — Ambulatory Visit: Payer: 59 | Admitting: Internal Medicine

## 2019-10-29 ENCOUNTER — Other Ambulatory Visit: Payer: Self-pay

## 2019-10-29 ENCOUNTER — Encounter: Payer: Self-pay | Admitting: Internal Medicine

## 2019-10-29 VITALS — BP 130/85 | HR 105 | Ht 66.0 in | Wt 131.6 lb

## 2019-10-29 DIAGNOSIS — I498 Other specified cardiac arrhythmias: Secondary | ICD-10-CM

## 2019-10-29 DIAGNOSIS — G90A Postural orthostatic tachycardia syndrome (POTS): Secondary | ICD-10-CM

## 2019-10-29 MED FILL — ZONISAMIDE 100 MG CAPSULE: 100 | 10 days supply | Qty: 20 | Fill #0

## 2019-10-29 NOTE — Patient Instructions (Signed)
Medication Instructions:  Your physician recommends that you continue on your current medications as directed. Please refer to the Current Medication list given to you today.  *If you need a refill on your cardiac medications before your next appointment, please call your pharmacy*   Lab Work: None ordered.  If you have labs (blood work) drawn today and your tests are completely normal, you will receive your results only by: Marland Kitchen MyChart Message (if you have MyChart) OR . A paper copy in the mail If you have any lab test that is abnormal or we need to change your treatment, we will call you to review the results.   Testing/Procedures: None ordered.    Follow-Up: At [facility], you and your health needs are our priority.  As part of our continuing mission to provide you with exceptional heart care, we have created designated Provider Care Teams.  These Care Teams include your primary Cardiologist (physician) and Advanced Practice Providers (APPs -  Physician Assistants and Nurse Practitioners) who all work together to provide you with the care you need, when you need it.  We recommend signing up for the patient portal called "MyChart".  Sign up information is provided on this After Visit Summary.  MyChart is used to connect with patients for Virtual Visits (Telemedicine).  Patients are able to view lab/test results, encounter notes, upcoming appointments, etc.  Non-urgent messages can be sent to your provider as well.   To learn more about what you can do with MyChart, go to [email or web address].    Your next appointment:  Virtual 01/13/2020 at 345 with Dr Graciela Husbands   Other Instructions Compression wear as discussed by Dr Graciela Husbands  Increase Fluid Intake  Monitor Salt intake d/t elevated blood pressure

## 2019-10-29 NOTE — Progress Notes (Signed)
ELECTROPHYSIOLOGY OFFICE NOTE  Patient ID: Rachel Little, MRN: [id number], DOB/AGE: December 16, 2000 19 y.o. Admit date: (Not on file) Date of Consult: 10/29/2019  Primary Physician: Patient, No Pcp Per Primary Cardiologist: none       HPI Rachel Little is a 19 y.o. female seen at the request of her father because of orthostatic tachycardia  She has a hx of abrupt onset/offset tachypalpitations which apparently are not that problematic--  She has hx of LH in the shower, in jacuzzis and standing in the heat as well as following physical exertionM (climbing 2 flights of stairs at home)  which dates back years  Two episodes of prodromally recognizable syncope around the time of middle school but with recurrent prodromal presyncope over recent years  Also has noted violaceous discoloration in showers   Not with standing this she has been able to hike with the rest of the family  About two months ago her symptoms became more severe, with arthralgias, headaches, increasing fatigue and some tachypalpitations. No specific triggers, no viral illness  Her father appreciated orthostatic tachycardia relieved by recumbency  Her diet is deplete of salt and fluid  Her menses have been heavy but is now on OCRx which has spaced them out to q3 months, still heavy. Not sure if symptoms are worse during period or not  Takes aldactone for acne (See Below)   Does not use alcohol or mja.         Past Medical History:  Diagnosis Date  . Anxiety and depression   . Arthralgias   . Dermatographism   . POTS (postural orthostatic tachycardia syndrome)   . Seasonal allergic rhinitis   . 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   Home Meds: Current Meds  Medication Sig  . ADDERALL XR 20 MG 24 hr capsule TAKE 1 CAPSULE BY MOUTH DAILY AFTER BREAKFAST.  . baclofen (LIORESAL) 10 MG  tablet Take 10 mg by mouth as needed.   Medication list is incomplete.  On pristiq etc  Allergies: No Known Allergies  Social History   Socioeconomic History  . Marital status: Single    Spouse name: Not on file  . Number of children: Not on file  . Years of education: Not on file  . Highest education level: Not on file  Occupational History  . Not on file  Tobacco Use  . Smoking status: Never Smoker  . Smokeless tobacco: Never Used  Substance and Sexual Activity  . Alcohol use: Not on file  . Drug use: Not on file  . Sexual activity: Not on file  Other Topics Concern  . Not on file  Social History Narrative  . Not on file   Social Determinants of Health   Financial Resource Strain:   . Difficulty of Paying Living Expenses: Not on file  Food Insecurity:   . Worried About Programme researcher, broadcasting/film/video in the Last Year: Not on file  . Ran Out of Food in the Last Year: Not on file  Transportation Needs:   . Lack of Transportation (Medical): Not on file  . Lack of Transportation (Non-Medical): Not on file  Physical Activity:   . Days of Exercise per Week: Not on file  . Minutes of Exercise per Session: Not on file  Stress:   . Feeling of Stress : Not on file  Social Connections:   . Frequency of Communication with Friends and Family: Not on file  . Frequency of Social Gatherings with Friends and Family: Not on file  . Attends Religious Services: Not on file  . Active Member of Clubs or Organizations: Not on file  . Attends Banker Meetings: Not on file  . Marital Status: Not on file  Intimate Partner Violence:   . Fear of Current or Ex-Partner: Not on file  . Emotionally Abused: Not on file  . Physically Abused: Not on file  . Sexually Abused: Not on file     No family history on file.   ROS:  Please see the history of present illness.      All other systems reviewed and negative.    Physical Exam:  Blood pressure 130/85, pulse (!) 105, height 5\' 6"   (1.676 m), weight 131 lb 9.6 oz (59.7 kg). General: Well developed, well nourished female in no acute distress. Head: Normocephalic, atraumatic, sclera non-icteric, no xanthomas, nares are without discharge. EENT: normal  Lymph Nodes:  none Neck: Negative for carotid bruits. JVD not elevated. Back:without scoliosis kyphosis* * Lungs: Clear bilaterally to auscultation without wheezes, rales, or rhonchi. Breathing is unlabored. Heart: RRR with loud S1 S2. No  murmur . No rubs, or gallops appreciated. Abdomen: Soft, non-tender, non-distended with normoactive bowel sounds. No hepatomegaly. No rebound/guarding. No obvious abdominal masses. Msk:  Strength and tone appear normal for age. Extremities: No clubbing or cyanosis. No  edema.  Distal pedal pulses are 2+ and equal bilaterally. Wing span> height; + thumb sign; minor increased flexibility on thumb/ulnar stress Skin: Warm and Dry Neuro: Alert and oriented X 3. CN III-XII intact Grossly normal sensory and motor function . Psych:  Responds to questions appropriately with a normal affect.      Labs: Cardiac Enzymes No results for input(s): CKTOTAL, CKMB, TROPONINI in the last 72 hours. CBC No results found for: WBC, HGB, HCT, MCV, PLT PROTIME: No results for input(s): LABPROT, INR in the last 72 hours. Chemistry No results for input(s): NA, K, CL, CO2, BUN, CREATININE, CALCIUM, PROT, BILITOT, ALKPHOS, ALT, AST, GLUCOSE in the last 168 hours.  Invalid input(s): LABALBU Lipids No results found for: CHOL, HDL, LDLCALC, TRIG BNP No results found for: PROBNP Thyroid Function Tests: No results for input(s): TSH, T4TOTAL, T3FREE, THYROIDAB in the last 72 hours.  Invalid input(s): FREET3 Miscellaneous No results found for: DDIMER  Radiology/Studies:  No results found.  EKG: *Sinus @ 86 12/28/33 No evidence of ventricular preexcitation   Assessment and Plan: * POTS   Syncope neurally mediated   arthalgias  Elevated blood  pressure (despite spironolactone 50)   Anxiety  SVT  Dermatographia   Wingspan/height inversion with + thumb sign and neg wrist-5th finger sign   Young lady with long standing of orthostatic, shower and heat intolerance ( jacuzzi) with recent exacerbation of symptoms over the last few months suggests an aggravating trigger of underlying mild dysautonomia.  Now with evidence of pruritic dermatographism that raises the possibility of concomitant mast cell activation, a recently speculated association. We will need to get somebody smarter than I to think about how best to look at tjhat; a brief review outlines a number of mediators in addition to tryptase that can be assessed ( on ice so prob when she returns to 14/08/35.  We discussed extensively the issues of dysautonomia, the physiology of orthstasis and positional stress.  We discussed the role of salt and water repletion,  the importance and value of exercise ( I told her it not a four letter word:)), often needing to be started in the recumbent position,  and the role of ambient heat and dehydration.  A caveat in her care is elevated blood pressure which limits the possibility of salt repletion.  This can be seen in hyperadrenergic POTS  (Occams razor means it should be connected to this other mess in some way)  With her tachycardia and elevated BP, she might also benefit from BB therapy-- I suspect some of her fatigue with walking may be related to the POTS    We discussed the role and strategy of Rx multiple BB, to be tried sequentially to see if we can find one which is well tolerated.   We also discussed the possible benefit of compression of thighs and /or abdomen, noting that calf compression offers little benefit.  Discussed mechanism of neurally mediated syncope which thankfully is more remote time wise, but the  Importance of recognizing and responding to the prodrome by sitting/lying down.  Further aggravating factors could included  anxiety/stress and depression. She is working on this   The triggering event is not clear but could be viral.  My hope would be that it would abate and her symptoms would return to normal  I am not sure how the arthralgias fit it.  She has a longer arm span to height, although only about 2 inches.  No family hx of Marfan-- but would get echo.    I have suggested she consider a handicapped parking pass given the challenges of walking long distances.-- if she can deal with people staring at her :))      Sherryl Manges

## 2019-11-10 ENCOUNTER — Encounter: Payer: Self-pay | Admitting: Psychiatry

## 2019-11-15 DIAGNOSIS — F411 Generalized anxiety disorder: Secondary | ICD-10-CM | POA: Diagnosis not present

## 2019-11-19 NOTE — Addendum Note (Signed)
Addended by: Thayer Headings, Nevaen Tredway L on: 11/19/2019 08:00 AM   Modules accepted: Orders

## 2019-11-23 DIAGNOSIS — F900 Attention-deficit hyperactivity disorder, predominantly inattentive type: Secondary | ICD-10-CM | POA: Diagnosis not present

## 2019-11-23 DIAGNOSIS — I498 Other specified cardiac arrhythmias: Secondary | ICD-10-CM | POA: Diagnosis not present

## 2019-12-23 DIAGNOSIS — F411 Generalized anxiety disorder: Secondary | ICD-10-CM | POA: Diagnosis not present

## 2019-12-31 DIAGNOSIS — F411 Generalized anxiety disorder: Secondary | ICD-10-CM | POA: Diagnosis not present

## 2020-01-05 DIAGNOSIS — F411 Generalized anxiety disorder: Secondary | ICD-10-CM | POA: Diagnosis not present

## 2020-01-07 DIAGNOSIS — I471 Supraventricular tachycardia: Secondary | ICD-10-CM | POA: Insufficient documentation

## 2020-01-07 DIAGNOSIS — G90A Postural orthostatic tachycardia syndrome (POTS): Secondary | ICD-10-CM | POA: Insufficient documentation

## 2020-01-12 DIAGNOSIS — F411 Generalized anxiety disorder: Secondary | ICD-10-CM | POA: Diagnosis not present

## 2020-01-13 ENCOUNTER — Other Ambulatory Visit: Payer: Self-pay

## 2020-01-13 ENCOUNTER — Telehealth: Payer: 59 | Admitting: Internal Medicine

## 2020-01-13 DIAGNOSIS — I471 Supraventricular tachycardia: Secondary | ICD-10-CM

## 2020-01-13 DIAGNOSIS — G90A Postural orthostatic tachycardia syndrome (POTS): Secondary | ICD-10-CM

## 2020-01-19 DIAGNOSIS — F411 Generalized anxiety disorder: Secondary | ICD-10-CM | POA: Diagnosis not present

## 2020-01-26 DIAGNOSIS — F411 Generalized anxiety disorder: Secondary | ICD-10-CM | POA: Diagnosis not present

## 2020-02-02 DIAGNOSIS — F411 Generalized anxiety disorder: Secondary | ICD-10-CM | POA: Diagnosis not present

## 2020-02-09 ENCOUNTER — Other Ambulatory Visit (HOSPITAL_COMMUNITY): Payer: Self-pay | Admitting: Family Medicine

## 2020-02-09 DIAGNOSIS — F411 Generalized anxiety disorder: Secondary | ICD-10-CM | POA: Diagnosis not present

## 2020-02-10 ENCOUNTER — Other Ambulatory Visit (HOSPITAL_COMMUNITY): Payer: Self-pay | Admitting: Family Medicine

## 2020-02-10 MED FILL — DESVENLAFAXINE SUC ER 100 M: 100 | 90 days supply | Qty: 90 | Fill #0

## 2020-02-16 ENCOUNTER — Other Ambulatory Visit (HOSPITAL_COMMUNITY): Payer: Self-pay | Admitting: Gynecology

## 2020-02-16 DIAGNOSIS — F411 Generalized anxiety disorder: Secondary | ICD-10-CM | POA: Diagnosis not present

## 2020-02-16 MED FILL — MARLISSA-28 TABLET: 0.15-30 | 63 days supply | Qty: 84 | Fill #0

## 2020-02-17 DIAGNOSIS — I498 Other specified cardiac arrhythmias: Secondary | ICD-10-CM

## 2020-02-17 DIAGNOSIS — G90A Postural orthostatic tachycardia syndrome (POTS): Secondary | ICD-10-CM

## 2020-02-22 NOTE — Telephone Encounter (Signed)
Nutrition referral placed per pt request.

## 2020-02-23 DIAGNOSIS — F411 Generalized anxiety disorder: Secondary | ICD-10-CM | POA: Diagnosis not present

## 2020-03-01 DIAGNOSIS — F411 Generalized anxiety disorder: Secondary | ICD-10-CM | POA: Diagnosis not present

## 2020-03-03 ENCOUNTER — Encounter: Payer: 59 | Attending: Internal Medicine | Admitting: Registered"

## 2020-03-03 ENCOUNTER — Other Ambulatory Visit: Payer: Self-pay

## 2020-03-03 DIAGNOSIS — I498 Other specified cardiac arrhythmias: Secondary | ICD-10-CM | POA: Insufficient documentation

## 2020-03-03 DIAGNOSIS — G90A Postural orthostatic tachycardia syndrome (POTS): Secondary | ICD-10-CM

## 2020-03-03 NOTE — Patient Instructions (Addendum)
For migraines:  Increasing omega 3 might help:  increasing fish intake at least 3x week.  Include walnuts in your diet.  Having fish oil on the days when not eating fish  Continue having less processed foods and more foods prepared at home  Consider including protein with your breakfast: eggs, cheese, nuts, any kind of meat. splitting up lunch into two smaller meals.  Continue wearing compression wear   Joint pain:  Try reducing sugar and artificial sweeteners for 3-4 days to see if it makes a difference. Reducing inflammation with omega 3 (above), whole grains, fruits & vegetables When your schedule allows start biking, rowing

## 2020-03-03 NOTE — Progress Notes (Signed)
Medical Nutrition Therapy  Appointment Start time:  23  Appointment End time:  1135  Primary concerns today: POTS, migraine, joint pain  Referral diagnosis: I49.8 (ICD-10-CM) - POTS (postural orthostatic tachycardia syndrome) Preferred learning style: no preference indicated Learning readiness: ready   NUTRITION ASSESSMENT   Anthropometrics  Wt Readings from Last 3 Encounters:  10/29/19 131 lb 9.6 oz (59.7 kg) (56 %, Z= 0.15)*    Clinical Medical Hx: ADHD (food texture, sensory), POTS, migraine Medications: reviewed Labs: none Notable Signs/Symptoms: light headedness, chronic migraines, joint pain (knees, ankles, heels, wrist, elbow)  Lifestyle & Dietary Hx  Patient states her POTS symptoms and joint pain both increased in intensity since last August (6 months).   Migraine: patient stated potential triggers strong scents, poor sleep, lights, stress.  POTS: Pt states she does not increase salt in the diet d/t elevated blood pressure. Pt reports she doesn't want to try beta blockers due to potential side effects. Pt states she uses spanks for compression and states it tends to help.  Physical activity: Pt states she had been more active but not lately because her time is more limited. Pt has been busy taking responsibility of cooking, daily chores and errands for the family because her mom is recovering from surgery.  Diet: avoids caffeine, lactose, preservatives. Pt reports some of her food choices are impacted by texture as well as sensory overload with ADHD. Pt states mac and cheese is one of her safety foods when feeling overwhelmed with what to eat and other ADHD symptoms. Pt states she does not have to resort to safety food that often.  Pt states she would benefit from a loose type of meal plan that would help take out the decision making about what to eat, but wants to avoid getting into tracking details such as calorie counting to make sure she maintains a healthy  relationship with food.  Estimated daily fluid intake: "not enough" Supplements: none or vitamin D sometimes Sleep: 8 hrs Stress / self-care:  Current average weekly physical activity: 15-30 min walking daily. Walks dogs.    24-Hr Dietary Recall First Meal: Life cereal, oatmilk or lactose free (something light) OR eggs, maybe English muffin, OJ Snack: none Second Meal: left overs Snack: none OR chips OR fruit Third Meal: chicken, rice, greens, sometimes salad, french bread Snack: ice cream OR cottage cheese Beverages: water, gatorade  Estimated Energy Needs Calories: ~1900  NUTRITION DIAGNOSIS  NI-5.1 Increased nutrient needs (specify): Omega 3 As related to increasing health fat in diet.  As evidenced by low reported intake of fish and walnuts.   NUTRITION INTERVENTION  Nutrition education (E-1) on the following topics:  . Balanced eating (MyPlate) . Role of omega 3, whole grain, fruits & vegetables in antiinflammatory diet . Using hunger/fullness cues for portions . Types of exercise that encourages the production of synovial fluid, which lubricates the joints  Handouts Provided Include   none  Learning Style & Readiness for Change Teaching method utilized: Visual & Auditory  Demonstrated degree of understanding via: Teach Back  Barriers to learning/adherence to lifestyle change: none  Goals Established by Pt For migraines:  Increasing omega 3 might help:  increasing fish intake at least 3x week.  Include walnuts in your diet.  Having fish oil on the days when not eating fish  Continue having less processed foods and more foods prepared at home  Consider including protein with your breakfast: eggs, cheese, nuts, any kind of meat. splitting up lunch into two  smaller meals.  Continue wearing compression wear   Joint pain:  Try reducing sugar and artificial sweeteners for 3-4 days to see if it makes a difference. Reducing inflammation with omega 3 (above),  whole grains, fruits & vegetables When your schedule allows start biking, rowingI   MONITORING & EVALUATION Dietary intake, weekly physical activity, and pain levels in 1 month.  Next Steps  Patient is to track pain level and POTS symptoms as above changes are being made in diet and lifestyle. Return for more specific meal planning.

## 2020-03-07 DIAGNOSIS — F411 Generalized anxiety disorder: Secondary | ICD-10-CM | POA: Diagnosis not present

## 2020-03-15 DIAGNOSIS — F411 Generalized anxiety disorder: Secondary | ICD-10-CM | POA: Diagnosis not present

## 2020-03-22 ENCOUNTER — Other Ambulatory Visit (HOSPITAL_COMMUNITY): Payer: Self-pay | Admitting: Psychiatry

## 2020-03-22 DIAGNOSIS — F321 Major depressive disorder, single episode, moderate: Secondary | ICD-10-CM | POA: Diagnosis not present

## 2020-03-22 DIAGNOSIS — F411 Generalized anxiety disorder: Secondary | ICD-10-CM | POA: Diagnosis not present

## 2020-03-22 DIAGNOSIS — F902 Attention-deficit hyperactivity disorder, combined type: Secondary | ICD-10-CM | POA: Diagnosis not present

## 2020-03-22 MED FILL — ADDERALL XR 20 MG CAP SA: 20 | 30 days supply | Qty: 30 | Fill #0

## 2020-03-27 DIAGNOSIS — Z6823 Body mass index (BMI) 23.0-23.9, adult: Secondary | ICD-10-CM | POA: Diagnosis not present

## 2020-03-27 DIAGNOSIS — M7989 Other specified soft tissue disorders: Secondary | ICD-10-CM | POA: Diagnosis not present

## 2020-03-27 DIAGNOSIS — M2550 Pain in unspecified joint: Secondary | ICD-10-CM | POA: Diagnosis not present

## 2020-03-29 DIAGNOSIS — F411 Generalized anxiety disorder: Secondary | ICD-10-CM | POA: Diagnosis not present

## 2020-03-31 ENCOUNTER — Encounter: Payer: 59 | Attending: Internal Medicine | Admitting: Registered"

## 2020-03-31 ENCOUNTER — Other Ambulatory Visit: Payer: Self-pay

## 2020-03-31 ENCOUNTER — Encounter: Payer: Self-pay | Admitting: Registered"

## 2020-03-31 DIAGNOSIS — Z713 Dietary counseling and surveillance: Secondary | ICD-10-CM | POA: Diagnosis not present

## 2020-03-31 NOTE — Progress Notes (Signed)
Follow-up Medical Nutrition Therapy  Appointment Start time:  10:50  Appointment End time:  11:30  Primary concerns today: POTS, migraine, joint pain  Referral diagnosis: I49.8 (ICD-10-CM) - POTS (postural orthostatic tachycardia syndrome) Preferred learning style: no preference indicated Learning readiness: ready   NUTRITION ASSESSMENT   Anthropometrics  Not accessed   Clinical Medical Hx: ADHD (food texture, sensory), POTS, migraine Medications: reviewed Labs: none Notable Signs/Symptoms: light headedness, chronic migraines- triggers strong scents, poor sleep, lights, stress, joint pain (knees, ankles, heels, wrist, elbow)  Lifestyle & Dietary Hx  Patient states she had joint pain flare up which triggers POTS symptoms but felt it was not as bad as it had been before.   Pt states she continues to help with cooking and chores at home. Pt states she is enjoying learning how to cook and feels it will be valuable skill when she is out on her own.  Pt states with ADHD she forgets to eat and is used to feeling hungry. Pt state snacking on fruit makes her uncomfortably hungry.  Estimated daily fluid intake: not assessed Supplements: started fish oil since last visit Sleep: 8 hrs Stress / self-care:  Current average weekly physical activity: 15-30 min walking daily. Walks dogs.   24-Hr Dietary Recall First Meal: oatmeal, berries, peanut butter, lactose free milk OR eggs Snack: none Second Meal: feta pasta (left overs) OR [place] sandwich, grapes OR tomato sandwich Snack: none Third Meal: chicken, asparagus or green beans with olive oil and vinaigrette, salad  Snack: zone bars OR nutrigrain bars OR applesauce Beverages: water, gatorade, propel (forgets to drink water)  Estimated Energy Needs Calories: ~1900  NUTRITION DIAGNOSIS  NI-5.1 Increased nutrient needs (specify): Omega 3 As related to increasing health fat in diet.  As evidenced by low reported intake of fish and  walnuts.   NUTRITION INTERVENTION  Nutrition education (E-1) on the following topics:  . Balanced eating (MyPlate) (more detail guidance) . Resources for continued building of cooking skills . Strategies to increase water intake  Handouts Provided Include   USDA Start Simple MyPlate Plan Menu  Learning Style & Readiness for Change Teaching method utilized: Visual & Auditory  Demonstrated degree of understanding via: Teach Back  Barriers to learning/adherence to lifestyle change: none  Goals Established by Pt Use handouts to help track balanced eating Walnuts.org website for recipes Could try recreating the [facility] Greek salad at home Med instead of Meds (Mediterranean Style of eating) [email or web address] Continue with learning cooking skills. Think of ways to remind you to drink water, keeping it near you, screen saver message on your phone, etc. Continue to monitor your symptoms to track progress.  MONITORING & EVALUATION Dietary intake, weekly physical activity, and pain levels in 1 month.  Next Steps  Patient is to track pain level and POTS symptoms. Return for check-in with how she is doing with meal planning.

## 2020-03-31 NOTE — Patient Instructions (Addendum)
Use handouts to help track balanced eating Walnuts.org website for recipes Could try recreating the [facility] Greek salad at home Med instead of Meds (Mediterranean Style of eating) [email or web address] Continue with learning cooking skills. Think of ways to remind you to drink water, keeping it near you, screen saver message on your phone, etc. Continue to monitor your symptoms to track progress.

## 2020-04-05 DIAGNOSIS — F411 Generalized anxiety disorder: Secondary | ICD-10-CM | POA: Diagnosis not present

## 2020-04-13 ENCOUNTER — Other Ambulatory Visit (HOSPITAL_COMMUNITY): Payer: Self-pay | Admitting: Internal Medicine

## 2020-04-13 DIAGNOSIS — M2550 Pain in unspecified joint: Secondary | ICD-10-CM | POA: Diagnosis not present

## 2020-04-13 DIAGNOSIS — Z6823 Body mass index (BMI) 23.0-23.9, adult: Secondary | ICD-10-CM | POA: Diagnosis not present

## 2020-04-13 DIAGNOSIS — R768 Other specified abnormal immunological findings in serum: Secondary | ICD-10-CM | POA: Diagnosis not present

## 2020-04-13 MED FILL — HYDROXYCHLOROQUINE SULFATE: 200 | 30 days supply | Qty: 30 | Fill #0

## 2020-04-19 DIAGNOSIS — F411 Generalized anxiety disorder: Secondary | ICD-10-CM | POA: Diagnosis not present

## 2020-04-25 ENCOUNTER — Other Ambulatory Visit (HOSPITAL_COMMUNITY): Payer: Self-pay

## 2020-04-25 DIAGNOSIS — N944 Primary dysmenorrhea: Secondary | ICD-10-CM | POA: Diagnosis not present

## 2020-04-25 DIAGNOSIS — Z6823 Body mass index (BMI) 23.0-23.9, adult: Secondary | ICD-10-CM | POA: Diagnosis not present

## 2020-04-25 DIAGNOSIS — Z3041 Encounter for surveillance of contraceptive pills: Secondary | ICD-10-CM | POA: Diagnosis not present

## 2020-04-25 DIAGNOSIS — Z01419 Encounter for gynecological examination (general) (routine) without abnormal findings: Secondary | ICD-10-CM | POA: Diagnosis not present

## 2020-04-25 MED ORDER — LEVONORGESTREL-ETHINYL ESTRAD 0.15-30 MG-MCG PO TABS
1.0000 | ORAL_TABLET | Freq: Every day | ORAL | 4 refills | Status: DC
  Filled 2020-04-25: qty 84, 84d supply, fill #0
  Filled 2020-04-26: qty 112, 84d supply, fill #0
  Filled 2020-08-01: qty 112, 84d supply, fill #1
  Filled 2020-11-06: qty 112, 84d supply, fill #2
  Filled 2021-01-26: qty 112, 84d supply, fill #3
  Filled 2021-04-04: qty 112, 84d supply, fill #4

## 2020-04-26 ENCOUNTER — Other Ambulatory Visit (HOSPITAL_COMMUNITY): Payer: Self-pay

## 2020-04-26 DIAGNOSIS — F411 Generalized anxiety disorder: Secondary | ICD-10-CM | POA: Diagnosis not present

## 2020-04-27 ENCOUNTER — Other Ambulatory Visit (HOSPITAL_COMMUNITY): Payer: Self-pay

## 2020-05-01 ENCOUNTER — Ambulatory Visit: Payer: 59 | Admitting: Registered"

## 2020-05-03 DIAGNOSIS — F411 Generalized anxiety disorder: Secondary | ICD-10-CM | POA: Diagnosis not present

## 2020-05-10 DIAGNOSIS — F411 Generalized anxiety disorder: Secondary | ICD-10-CM | POA: Diagnosis not present

## 2020-05-17 DIAGNOSIS — F411 Generalized anxiety disorder: Secondary | ICD-10-CM | POA: Diagnosis not present

## 2020-05-19 ENCOUNTER — Other Ambulatory Visit (HOSPITAL_COMMUNITY): Payer: Self-pay

## 2020-05-19 DIAGNOSIS — F902 Attention-deficit hyperactivity disorder, combined type: Secondary | ICD-10-CM | POA: Diagnosis not present

## 2020-05-19 DIAGNOSIS — F411 Generalized anxiety disorder: Secondary | ICD-10-CM | POA: Diagnosis not present

## 2020-05-19 DIAGNOSIS — F321 Major depressive disorder, single episode, moderate: Secondary | ICD-10-CM | POA: Diagnosis not present

## 2020-05-19 MED ORDER — AMPHETAMINE-DEXTROAMPHET ER 20 MG PO CP24: 20.0000 mg | ORAL_CAPSULE | Freq: Every morning | ORAL | 0 refills | Status: DC

## 2020-05-19 MED ORDER — ADDERALL XR 20 MG PO CP24
20.0000 mg | ORAL_CAPSULE | Freq: Every morning | ORAL | 0 refills | Status: DC
  Filled 2020-05-19: qty 30, 30d supply, fill #0

## 2020-05-19 MED ORDER — DESVENLAFAXINE SUCCINATE ER 100 MG PO TB24
100.0000 mg | ORAL_TABLET | Freq: Every day | ORAL | 1 refills | Status: DC
  Filled 2020-05-19: qty 30, 30d supply, fill #0
  Filled 2020-07-31: qty 30, 30d supply, fill #1

## 2020-05-22 ENCOUNTER — Other Ambulatory Visit (HOSPITAL_COMMUNITY): Payer: Self-pay

## 2020-05-22 MED FILL — Hydroxychloroquine Sulfate Tab 200 MG: ORAL | 30 days supply | Qty: 30 | Fill #0 | Status: AC

## 2020-05-24 DIAGNOSIS — F411 Generalized anxiety disorder: Secondary | ICD-10-CM | POA: Diagnosis not present

## 2020-06-05 ENCOUNTER — Other Ambulatory Visit (HOSPITAL_COMMUNITY): Payer: Self-pay

## 2020-06-05 MED ORDER — SPIRONOLACTONE 50 MG PO TABS
50.0000 mg | ORAL_TABLET | Freq: Two times a day (BID) | ORAL | 0 refills | Status: DC
  Filled 2020-06-05: qty 180, 90d supply, fill #0

## 2020-07-13 ENCOUNTER — Other Ambulatory Visit (HOSPITAL_COMMUNITY): Payer: Self-pay

## 2020-07-13 DIAGNOSIS — F321 Major depressive disorder, single episode, moderate: Secondary | ICD-10-CM | POA: Diagnosis not present

## 2020-07-13 DIAGNOSIS — F411 Generalized anxiety disorder: Secondary | ICD-10-CM | POA: Diagnosis not present

## 2020-07-13 DIAGNOSIS — F902 Attention-deficit hyperactivity disorder, combined type: Secondary | ICD-10-CM | POA: Diagnosis not present

## 2020-07-13 MED ORDER — DESVENLAFAXINE SUCCINATE ER 100 MG PO TB24: 100.0000 mg | ORAL_TABLET | Freq: Every day | ORAL | 1 refills | Status: DC

## 2020-07-13 MED ORDER — AMPHETAMINE-DEXTROAMPHET ER 20 MG PO CP24: 20.0000 mg | ORAL_CAPSULE | Freq: Every morning | ORAL | 0 refills | Status: DC

## 2020-07-19 DIAGNOSIS — M2550 Pain in unspecified joint: Secondary | ICD-10-CM | POA: Diagnosis not present

## 2020-07-19 DIAGNOSIS — Z79899 Other long term (current) drug therapy: Secondary | ICD-10-CM | POA: Diagnosis not present

## 2020-07-19 DIAGNOSIS — Z6824 Body mass index (BMI) 24.0-24.9, adult: Secondary | ICD-10-CM | POA: Diagnosis not present

## 2020-07-19 DIAGNOSIS — R768 Other specified abnormal immunological findings in serum: Secondary | ICD-10-CM | POA: Diagnosis not present

## 2020-07-26 ENCOUNTER — Other Ambulatory Visit (HOSPITAL_COMMUNITY): Payer: Self-pay

## 2020-07-26 ENCOUNTER — Other Ambulatory Visit: Payer: Self-pay

## 2020-07-26 ENCOUNTER — Ambulatory Visit: Payer: 59 | Admitting: Physician Assistant

## 2020-07-26 ENCOUNTER — Encounter: Payer: Self-pay | Admitting: Physician Assistant

## 2020-07-26 VITALS — BP 117/78 | HR 80 | Wt 155.0 lb

## 2020-07-26 DIAGNOSIS — G43709 Chronic migraine without aura, not intractable, without status migrainosus: Secondary | ICD-10-CM

## 2020-07-26 MED ORDER — EMGALITY 120 MG/ML ~~LOC~~ SOAJ
SUBCUTANEOUS | 11 refills | Status: AC
  Filled 2020-07-26 – 2020-08-01 (×2): qty 1, 28d supply, fill #0
  Filled 2020-08-01: qty 1, 30d supply, fill #0
  Filled 2020-09-06: qty 1, 30d supply, fill #1
  Filled 2021-04-04: qty 1, 30d supply, fill #2

## 2020-07-26 NOTE — Progress Notes (Signed)
History:  Rachel Little is a 20 y.o. who presents to clinic today for eval of headaches.  She started having headaches in early childhood.  She used allergy medication regularly as well as motrin, which were thought to be causing rebound headaches. No change in headaches after discontinuation but also no worsening of allergies. HAs are severe, located unilateral around eyes (either R or L) and back of head and temples.  There is throbbing, movement/lights/noises/smells are all a problem.  There is eye strain, nausea.  No vomiting.  They last for hours to all day. [facility].  Is eating more fresh foods and less processed foods.  She was on medication that was causing weight loss - zonisamide.  This helped her headaches but she continued to lose weight and was concerned regarding this unintended effect.  Triggers include scents, weather changes, stress.  POTS and migraine trigger each other.  Baclofen is rescue medicine.  Headaches worse in college - [place].  She will begin at [employer] in the fall.     Other meds tried include: Motrin, Biofreeze, zonisamide, pristiq (Desvenlafaxine), wellbutrin.  She had trigger point injections/nerve blocks as well. She blocks scents with a topical mint essential oil product.  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     Social History   Socioeconomic History   Marital status: Single    Spouse name: Not on file   Number of children: Not on file   Years of education: Not on file   Highest education level: Not on file  Occupational History   Not on file   Tobacco Use   Smoking status: Never   Smokeless tobacco: Never  Substance and Sexual Activity   Alcohol use: Not Currently   Drug use: Not Currently   Sexual activity: Not on file  Other Topics Concern   Not on file  Social History Narrative   Not on file   Social Determinants of Health   Financial Resource Strain: Not on file  Food Insecurity: Not on file  Transportation Needs: Not on file  Physical Activity: Not on file  Stress: Not on file  Social Connections: Not on file  Intimate Partner Violence: Not on file    History reviewed. No pertinent family history.  No Known Allergies  Current Outpatient Medications on File Prior to Visit  Medication Sig Dispense Refill   ADDERALL XR 20 MG 24 hr capsule Take 1 capsule by mouth every morning 30 capsule 0   baclofen (LIORESAL) 10 MG tablet Take 10 mg by mouth as needed.     desvenlafaxine (PRISTIQ) 100 MG 24 hr tablet TAKE 1 TABLET BY MOUTH DAILY 30 tablet 1   fish oil-omega-3 fatty acids 1000 MG capsule Take 1 g by mouth daily.     hydroxychloroquine (PLAQUENIL) 200 MG tablet TAKE 1 TABLET BY MOUTH ONCE DAILY WITH FOOD OR MILK. 30 tablet 3   MARLISSA 0.15-30 MG-MCG tablet TAKE 1 TABLET BY MOUTH ONCE DAILY AS DIRECTED FOR LONG CYCLE 84 tablet 0   spironolactone (ALDACTONE) 50 MG  tablet Take 1 tablet (50 mg total) by mouth 2 (two) times daily. 180 tablet 0   amphetamine-dextroamphetamine (ADDERALL XR) 20 MG 24 hr capsule TAKE 1 CAPSULE BY MOUTH EVERY MORNING FILL AFTER 04/20/20 30 capsule 0   amphetamine-dextroamphetamine (ADDERALL XR) 20 MG 24 hr capsule Take 1 capsule by mouth every morning 30 capsule 0   [START ON 08/16/2020] amphetamine-dextroamphetamine (ADDERALL XR) 20 MG 24 hr capsule Take 1 capsule by mouth every morning 30 capsule 0   amphetamine-dextroamphetamine (ADDERALL XR) 20 MG 24 hr capsule Take 1 capsule by mouth every morning 30 capsule 0   desvenlafaxine (PRISTIQ) 100 MG 24 hr tablet TAKE 1 TABLET BY MOUTH ONCE  DAILY. 90 tablet 0   desvenlafaxine (PRISTIQ) 100 MG 24 hr tablet TAKE ONE TABLET BY MOUTH DAILY 90 tablet 0   desvenlafaxine (PRISTIQ) 100 MG 24 hr tablet Take 1 tablet by mouth daily 30 tablet 1   desvenlafaxine (PRISTIQ) 100 MG 24 hr tablet Take 1 tablet (100 mg total) by mouth daily after lunch or dinner 30 tablet 1   desvenlafaxine (PRISTIQ) 50 MG 24 hr tablet Take 50 mg by mouth daily.     MARLISSA 0.15-30 MG-MCG tablet Take 1 tablet by mouth daily.     naproxen (NAPROSYN) 500 MG tablet Take 1 tablet (500 mg total) by mouth 2 (two) times daily. 30 tablet 0   spironolactone (ALDACTONE) 50 MG tablet Take 100 mg by mouth daily.      zonisamide (ZONEGRAN) 100 MG capsule TAKE 2 CAPSULES BY MOUTH DAILY AS DIRECTED 20 capsule 0   [DISCONTINUED] buPROPion (WELLBUTRIN XL) 150 MG 24 hr tablet Take 1 tablet (150 mg total) by mouth daily after breakfast. 90 tablet 1   [DISCONTINUED] cetirizine (ZYRTEC) 5 MG chewable tablet Chew 5 mg by mouth daily.     [DISCONTINUED] escitalopram (LEXAPRO) 10 MG tablet Take 1 tablet (10 mg total) by mouth at bedtime. 90 tablet 1   [DISCONTINUED] montelukast (SINGULAIR) 4 MG chewable tablet Chew 4 mg by mouth at bedtime.     No current facility-administered medications on file prior to visit.     Review of Systems:  All pertinent positive/negative included in HPI, all other review of systems are negative   Objective:  Physical Exam BP 117/78   Pulse 80   Wt 155 lb (70.3 kg)   BMI 25.02 kg/m  CONSTITUTIONAL: Well-developed, well-nourished female in no acute distress.  EYES: EOM intact ENT: Normocephalic CARDIOVASCULAR: Regular rate  RESPIRATORY: Normal rate.  MUSCULOSKELETAL: Normal ROM SKIN: Warm, dry without erythema  NEUROLOGICAL: Alert, oriented, CN II-XII grossly intact, Appropriate balance,PSYCH: Normal behavior, mood   Assessment & Plan:  Assessment: 1. Chronic migraine without aura without status migrainosus, not intractable   Unstable  problem  Plan:  Emgality for migraine prevention- begin with 2 injections today and then 1 every 30 days thereafter.  Sample and savings card provided.  Demo pen utilized for pt education. May continue to use baclofen for break through headaches.  Also - may use motrin as needed but limit to 2 days per week. Can trial Nurtec if Emgality is not resolving her headaches.  Follow-up in 3 months or sooner PRN  Total time spent: 47 minutes  Charlyne Petrin 07/26/2020 4:29 PM

## 2020-07-26 NOTE — Patient Instructions (Signed)
Migraine Headache A migraine headache is an intense, throbbing pain on one side or both sides of the head. Migraine headaches may also cause other symptoms, such as nausea, vomiting, and sensitivity to light and noise. A migraine headache can last from 4 hours to 3 days. Talk with your doctor about what things may bring on (trigger) your migraine headaches. What are the causes? The exact cause of this condition is not known. However, a migraine may be caused when nerves in the brain become irritated and release chemicals that cause inflammation of blood vessels. This inflammation causes pain. This condition may be triggered or caused by: Drinking alcohol. Smoking. Taking medicines, such as: Medicine used to treat chest pain (nitroglycerin). Birth control pills. Estrogen. Certain blood pressure medicines. Eating or drinking products that contain nitrates, glutamate, aspartame, or tyramine. Aged cheeses, chocolate, or caffeine may also be triggers. Doing physical activity. Other things that may trigger a migraine headache include: Menstruation. Pregnancy. Hunger. Stress. Lack of sleep or too much sleep. Weather changes. Fatigue. What increases the risk? The following factors may make you more likely to experience migraine headaches: Being a certain age. This condition is more common in people who are 25-55 years old. Being female. Having a family history of migraine headaches. Being Caucasian. Having a mental health condition, such as depression or anxiety. Being obese. What are the signs or symptoms? The main symptom of this condition is pulsating or throbbing pain. This pain may: Happen in any area of the head, such as on one side or both sides. Interfere with daily activities. Get worse with physical activity. Get worse with exposure to bright lights or loud noises. Other symptoms may include: Nausea. Vomiting. Dizziness. General sensitivity to bright lights, loud noises, or  smells. Before you get a migraine headache, you may get warning signs (an aura). An aura may include: Seeing flashing lights or having blind spots. Seeing bright spots, halos, or zigzag lines. Having tunnel vision or blurred vision. Having numbness or a tingling feeling. Having trouble talking. Having muscle weakness. Some people have symptoms after a migraine headache (postdromal phase), such as: Feeling tired. Difficulty concentrating. How is this diagnosed? A migraine headache can be diagnosed based on: Your symptoms. A physical exam. Tests, such as: CT scan or an MRI of the head. These imaging tests can help rule out other causes of headaches. Taking fluid from the spine (lumbar puncture) and analyzing it (cerebrospinal fluid analysis, or CSF analysis). How is this treated? This condition may be treated with medicines that: Relieve pain. Relieve nausea. Prevent migraine headaches. Treatment for this condition may also include: Acupuncture. Lifestyle changes like avoiding foods that trigger migraine headaches. Biofeedback. Cognitive behavioral therapy. Follow these instructions at home: Medicines Take over-the-counter and prescription medicines only as told by your health care provider. Ask your health care provider if the medicine prescribed to you: Requires you to avoid driving or using heavy machinery. Can cause constipation. You may need to take these actions to prevent or treat constipation: Drink enough fluid to keep your urine pale yellow. Take over-the-counter or prescription medicines. Eat foods that are high in fiber, such as beans, whole grains, and fresh fruits and vegetables. Limit foods that are high in fat and processed sugars, such as fried or sweet foods. Lifestyle Do not drink alcohol. Do not use any products that contain nicotine or tobacco, such as cigarettes, e-cigarettes, and chewing tobacco. If you need help quitting, ask your health care  provider. Get at least 8   hours of sleep every night. Find ways to manage stress, such as meditation, deep breathing, or yoga. General instructions   Keep a journal to find out what may trigger your migraine headaches. For example, write down: What you eat and drink. How much sleep you get. Any change to your diet or medicines. If you have a migraine headache: Avoid things that make your symptoms worse, such as bright lights. It may help to lie down in a dark, quiet room. Do not drive or use heavy machinery. Ask your health care provider what activities are safe for you while you are experiencing symptoms. Keep all follow-up visits as told by your health care provider. This is important. Contact a health care provider if: You develop symptoms that are different or more severe than your usual migraine headache symptoms. You have more than 15 headache days in one month. Get help right away if: Your migraine headache becomes severe. Your migraine headache lasts longer than 72 hours. You have a fever. You have a stiff neck. You have vision loss. Your muscles feel weak or like you cannot control them. You start to lose your balance often. You have trouble walking. You faint. You have a seizure. Summary A migraine headache is an intense, throbbing pain on one side or both sides of the head. Migraines may also cause other symptoms, such as nausea, vomiting, and sensitivity to light and noise. This condition may be treated with medicines and lifestyle changes. You may also need to avoid certain things that trigger a migraine headache. Keep a journal to find out what may trigger your migraine headaches. Contact your health care provider if you have more than 15 headache days in a month or you develop symptoms that are different or more severe than your usual migraine headache symptoms. This information is not intended to replace advice given to you by your health care provider. Make sure you  discuss any questions you have with your health care provider. Document Revised: 05/01/2018 Document Reviewed: 02/19/2018 Elsevier Patient Education  2022 [employer]  

## 2020-07-27 ENCOUNTER — Other Ambulatory Visit (HOSPITAL_COMMUNITY): Payer: Self-pay

## 2020-07-27 DIAGNOSIS — G43709 Chronic migraine without aura, not intractable, without status migrainosus: Secondary | ICD-10-CM | POA: Insufficient documentation

## 2020-07-31 ENCOUNTER — Other Ambulatory Visit (HOSPITAL_COMMUNITY): Payer: Self-pay

## 2020-08-01 ENCOUNTER — Other Ambulatory Visit (HOSPITAL_COMMUNITY): Payer: Self-pay

## 2020-08-01 MED FILL — Hydroxychloroquine Sulfate Tab 200 MG: ORAL | 30 days supply | Qty: 30 | Fill #1 | Status: AC

## 2020-08-01 MED FILL — Amphetamine-Dextroamphetamine Cap ER 24HR 20 MG: ORAL | 30 days supply | Qty: 30 | Fill #0 | Status: AC

## 2020-08-02 ENCOUNTER — Other Ambulatory Visit (HOSPITAL_COMMUNITY): Payer: Self-pay

## 2020-08-04 ENCOUNTER — Other Ambulatory Visit (HOSPITAL_COMMUNITY): Payer: Self-pay

## 2020-08-08 ENCOUNTER — Other Ambulatory Visit (HOSPITAL_COMMUNITY): Payer: Self-pay

## 2020-08-08 MED ORDER — LEVONORGESTREL-ETHINYL ESTRAD 0.15-30 MG-MCG PO TABS
1.0000 | ORAL_TABLET | Freq: Every day | ORAL | 4 refills | Status: DC
  Filled 2020-08-08: qty 84, 84d supply, fill #0

## 2020-08-10 ENCOUNTER — Encounter: Payer: Self-pay | Admitting: *Deleted

## 2020-08-10 ENCOUNTER — Other Ambulatory Visit (HOSPITAL_COMMUNITY): Payer: Self-pay

## 2020-09-06 ENCOUNTER — Other Ambulatory Visit (HOSPITAL_COMMUNITY): Payer: Self-pay

## 2020-09-06 DIAGNOSIS — F321 Major depressive disorder, single episode, moderate: Secondary | ICD-10-CM | POA: Diagnosis not present

## 2020-09-06 DIAGNOSIS — F902 Attention-deficit hyperactivity disorder, combined type: Secondary | ICD-10-CM | POA: Diagnosis not present

## 2020-09-06 DIAGNOSIS — F411 Generalized anxiety disorder: Secondary | ICD-10-CM | POA: Diagnosis not present

## 2020-09-06 MED ORDER — DESVENLAFAXINE SUCCINATE ER 100 MG PO TB24
100.0000 mg | ORAL_TABLET | Freq: Every day | ORAL | 1 refills | Status: DC
  Filled 2020-09-06: qty 30, 30d supply, fill #0
  Filled 2020-10-10: qty 30, 30d supply, fill #1

## 2020-09-06 MED ORDER — AMPHETAMINE-DEXTROAMPHET ER 15 MG PO CP24
15.0000 mg | ORAL_CAPSULE | Freq: Every morning | ORAL | 0 refills | Status: DC
  Filled 2020-09-06: qty 30, 30d supply, fill #0

## 2020-09-06 MED ORDER — AMPHETAMINE-DEXTROAMPHET ER 15 MG PO CP24
15.0000 mg | ORAL_CAPSULE | Freq: Every morning | ORAL | 0 refills | Status: DC
  Filled 2020-10-10: qty 30, 30d supply, fill #0

## 2020-09-08 ENCOUNTER — Other Ambulatory Visit (HOSPITAL_COMMUNITY): Payer: Self-pay

## 2020-09-08 MED FILL — Hydroxychloroquine Sulfate Tab 200 MG: ORAL | 30 days supply | Qty: 30 | Fill #2 | Status: AC

## 2020-09-13 ENCOUNTER — Telehealth: Payer: Self-pay | Admitting: Radiology

## 2020-09-13 NOTE — Telephone Encounter (Signed)
Left message for patient to call and schedule headache f/u for October with Nada Maclachlan

## 2020-10-10 ENCOUNTER — Other Ambulatory Visit (HOSPITAL_COMMUNITY): Payer: Self-pay

## 2020-11-02 ENCOUNTER — Other Ambulatory Visit (HOSPITAL_COMMUNITY): Payer: Self-pay

## 2020-11-02 DIAGNOSIS — F321 Major depressive disorder, single episode, moderate: Secondary | ICD-10-CM | POA: Diagnosis not present

## 2020-11-02 DIAGNOSIS — F902 Attention-deficit hyperactivity disorder, combined type: Secondary | ICD-10-CM | POA: Diagnosis not present

## 2020-11-02 DIAGNOSIS — F411 Generalized anxiety disorder: Secondary | ICD-10-CM | POA: Diagnosis not present

## 2020-11-02 MED ORDER — DESVENLAFAXINE SUCCINATE ER 100 MG PO TB24
100.0000 mg | ORAL_TABLET | Freq: Every day | ORAL | 1 refills | Status: DC
  Filled 2020-11-02: qty 30, 30d supply, fill #0

## 2020-11-03 ENCOUNTER — Other Ambulatory Visit (HOSPITAL_COMMUNITY): Payer: Self-pay

## 2020-11-03 MED ORDER — AMPHETAMINE-DEXTROAMPHET ER 15 MG PO CP24
15.0000 mg | ORAL_CAPSULE | Freq: Every morning | ORAL | 0 refills | Status: DC
  Filled 2020-11-09: qty 30, 30d supply, fill #0

## 2020-11-03 MED ORDER — AMPHETAMINE-DEXTROAMPHET ER 15 MG PO CP24
15.0000 mg | ORAL_CAPSULE | Freq: Every morning | ORAL | 0 refills | Status: DC
  Filled 2020-12-26: qty 30, 30d supply, fill #0

## 2020-11-06 ENCOUNTER — Other Ambulatory Visit (HOSPITAL_COMMUNITY): Payer: Self-pay

## 2020-11-09 ENCOUNTER — Other Ambulatory Visit (HOSPITAL_COMMUNITY): Payer: Self-pay

## 2020-12-22 ENCOUNTER — Telehealth: Payer: 59 | Admitting: Physician Assistant

## 2020-12-22 ENCOUNTER — Telehealth: Payer: Self-pay | Admitting: Radiology

## 2020-12-22 NOTE — Telephone Encounter (Signed)
Called to start mycahrt visit with Rachel Little, no answer left message to call [facility]

## 2020-12-26 ENCOUNTER — Other Ambulatory Visit (HOSPITAL_COMMUNITY): Payer: Self-pay

## 2020-12-27 ENCOUNTER — Other Ambulatory Visit (HOSPITAL_COMMUNITY): Payer: Self-pay

## 2020-12-27 MED ORDER — SPIRONOLACTONE 50 MG PO TABS
50.0000 mg | ORAL_TABLET | Freq: Two times a day (BID) | ORAL | 11 refills | Status: DC
  Filled 2020-12-27: qty 60, 30d supply, fill #0
  Filled 2021-04-04: qty 60, 30d supply, fill #1

## 2021-01-03 ENCOUNTER — Other Ambulatory Visit (HOSPITAL_COMMUNITY): Payer: Self-pay

## 2021-01-03 DIAGNOSIS — F902 Attention-deficit hyperactivity disorder, combined type: Secondary | ICD-10-CM | POA: Diagnosis not present

## 2021-01-03 DIAGNOSIS — F411 Generalized anxiety disorder: Secondary | ICD-10-CM | POA: Diagnosis not present

## 2021-01-03 DIAGNOSIS — F321 Major depressive disorder, single episode, moderate: Secondary | ICD-10-CM | POA: Diagnosis not present

## 2021-01-03 MED ORDER — DESVENLAFAXINE SUCCINATE ER 100 MG PO TB24
100.0000 mg | ORAL_TABLET | ORAL | 1 refills | Status: DC
  Filled 2021-01-03: qty 30, 30d supply, fill #0
  Filled 2021-02-20: qty 30, 30d supply, fill #1

## 2021-01-04 ENCOUNTER — Other Ambulatory Visit (HOSPITAL_COMMUNITY): Payer: Self-pay

## 2021-01-04 MED ORDER — AMPHETAMINE-DEXTROAMPHET ER 15 MG PO CP24: 15.0000 mg | ORAL_CAPSULE | Freq: Every morning | ORAL | 0 refills | Status: DC

## 2021-01-04 MED ORDER — AMPHETAMINE-DEXTROAMPHET ER 15 MG PO CP24
15.0000 mg | ORAL_CAPSULE | Freq: Every morning | ORAL | 0 refills | Status: DC
  Filled 2021-02-26: qty 30, 30d supply, fill #0

## 2021-01-05 ENCOUNTER — Other Ambulatory Visit (HOSPITAL_COMMUNITY): Payer: Self-pay

## 2021-01-08 ENCOUNTER — Other Ambulatory Visit (HOSPITAL_COMMUNITY): Payer: Self-pay

## 2021-01-08 MED ORDER — HYDROXYCHLOROQUINE SULFATE 200 MG PO TABS
200.0000 mg | ORAL_TABLET | Freq: Every day | ORAL | 0 refills | Status: DC
  Filled 2021-01-08: qty 90, 90d supply, fill #0

## 2021-01-23 DIAGNOSIS — E663 Overweight: Secondary | ICD-10-CM | POA: Diagnosis not present

## 2021-01-23 DIAGNOSIS — M2550 Pain in unspecified joint: Secondary | ICD-10-CM | POA: Diagnosis not present

## 2021-01-23 DIAGNOSIS — Z6825 Body mass index (BMI) 25.0-25.9, adult: Secondary | ICD-10-CM | POA: Diagnosis not present

## 2021-01-23 DIAGNOSIS — R768 Other specified abnormal immunological findings in serum: Secondary | ICD-10-CM | POA: Diagnosis not present

## 2021-01-23 DIAGNOSIS — Z79899 Other long term (current) drug therapy: Secondary | ICD-10-CM | POA: Diagnosis not present

## 2021-01-26 ENCOUNTER — Other Ambulatory Visit (HOSPITAL_COMMUNITY): Payer: Self-pay

## 2021-02-20 ENCOUNTER — Other Ambulatory Visit (HOSPITAL_COMMUNITY): Payer: Self-pay

## 2021-02-21 ENCOUNTER — Other Ambulatory Visit (HOSPITAL_COMMUNITY): Payer: Self-pay

## 2021-02-26 ENCOUNTER — Other Ambulatory Visit (HOSPITAL_COMMUNITY): Payer: Self-pay

## 2021-03-05 ENCOUNTER — Other Ambulatory Visit (HOSPITAL_COMMUNITY): Payer: Self-pay

## 2021-03-05 DIAGNOSIS — F411 Generalized anxiety disorder: Secondary | ICD-10-CM | POA: Diagnosis not present

## 2021-03-05 DIAGNOSIS — F321 Major depressive disorder, single episode, moderate: Secondary | ICD-10-CM | POA: Diagnosis not present

## 2021-03-05 DIAGNOSIS — F902 Attention-deficit hyperactivity disorder, combined type: Secondary | ICD-10-CM | POA: Diagnosis not present

## 2021-03-05 MED ORDER — DESVENLAFAXINE SUCCINATE ER 100 MG PO TB24
100.0000 mg | ORAL_TABLET | Freq: Every day | ORAL | 1 refills | Status: DC
  Filled 2021-03-05 – 2021-04-04 (×2): qty 30, 30d supply, fill #0

## 2021-03-05 MED ORDER — AMPHETAMINE-DEXTROAMPHET ER 15 MG PO CP24: 15.0000 mg | ORAL_CAPSULE | Freq: Every morning | ORAL | 0 refills | Status: DC

## 2021-03-05 MED ORDER — AMPHETAMINE-DEXTROAMPHET ER 15 MG PO CP24
15.0000 mg | ORAL_CAPSULE | Freq: Every morning | ORAL | 0 refills | Status: DC
  Filled 2021-03-05 – 2021-04-04 (×2): qty 30, 30d supply, fill #0

## 2021-03-12 ENCOUNTER — Other Ambulatory Visit (HOSPITAL_COMMUNITY): Payer: Self-pay

## 2021-04-04 ENCOUNTER — Other Ambulatory Visit (HOSPITAL_COMMUNITY): Payer: Self-pay

## 2021-05-02 ENCOUNTER — Other Ambulatory Visit (HOSPITAL_COMMUNITY): Payer: Self-pay

## 2021-05-02 DIAGNOSIS — F411 Generalized anxiety disorder: Secondary | ICD-10-CM | POA: Diagnosis not present

## 2021-05-02 DIAGNOSIS — F902 Attention-deficit hyperactivity disorder, combined type: Secondary | ICD-10-CM | POA: Diagnosis not present

## 2021-05-02 DIAGNOSIS — F321 Major depressive disorder, single episode, moderate: Secondary | ICD-10-CM | POA: Diagnosis not present

## 2021-05-02 MED ORDER — AMPHETAMINE-DEXTROAMPHET ER 15 MG PO CP24
15.0000 mg | ORAL_CAPSULE | Freq: Every morning | ORAL | 0 refills | Status: DC
  Filled 2021-05-02: qty 30, 30d supply, fill #0

## 2021-05-02 MED ORDER — AMPHETAMINE-DEXTROAMPHET ER 15 MG PO CP24
15.0000 mg | ORAL_CAPSULE | Freq: Every morning | ORAL | 0 refills | Status: DC
  Filled 2021-06-26: qty 30, 30d supply, fill #0

## 2021-05-02 MED ORDER — DESVENLAFAXINE SUCCINATE ER 100 MG PO TB24
100.0000 mg | ORAL_TABLET | Freq: Every day | ORAL | 1 refills | Status: DC
  Filled 2021-05-02: qty 30, 30d supply, fill #0
  Filled 2021-06-14: qty 30, 30d supply, fill #1

## 2021-05-10 ENCOUNTER — Other Ambulatory Visit (HOSPITAL_COMMUNITY): Payer: Self-pay

## 2021-05-11 ENCOUNTER — Other Ambulatory Visit (HOSPITAL_COMMUNITY): Payer: Self-pay

## 2021-05-11 MED ORDER — HYDROXYCHLOROQUINE SULFATE 200 MG PO TABS
200.0000 mg | ORAL_TABLET | Freq: Every day | ORAL | 0 refills | Status: DC
  Filled 2021-05-11: qty 90, 90d supply, fill #0

## 2021-06-14 ENCOUNTER — Other Ambulatory Visit (HOSPITAL_COMMUNITY): Payer: Self-pay

## 2021-06-26 ENCOUNTER — Other Ambulatory Visit (HOSPITAL_COMMUNITY): Payer: Self-pay

## 2021-07-02 ENCOUNTER — Other Ambulatory Visit (HOSPITAL_COMMUNITY): Payer: Self-pay

## 2021-07-02 DIAGNOSIS — F902 Attention-deficit hyperactivity disorder, combined type: Secondary | ICD-10-CM | POA: Diagnosis not present

## 2021-07-02 DIAGNOSIS — F321 Major depressive disorder, single episode, moderate: Secondary | ICD-10-CM | POA: Diagnosis not present

## 2021-07-02 DIAGNOSIS — F411 Generalized anxiety disorder: Secondary | ICD-10-CM | POA: Diagnosis not present

## 2021-07-02 MED ORDER — AMPHETAMINE-DEXTROAMPHET ER 15 MG PO CP24
15.0000 mg | ORAL_CAPSULE | Freq: Every morning | ORAL | 0 refills | Status: DC
  Filled 2021-10-12: qty 30, 30d supply, fill #0

## 2021-07-02 MED ORDER — AMPHETAMINE-DEXTROAMPHET ER 15 MG PO CP24
15.0000 mg | ORAL_CAPSULE | Freq: Every morning | ORAL | 0 refills | Status: DC
  Filled 2021-07-02 – 2021-12-12 (×2): qty 30, 30d supply, fill #0

## 2021-07-02 MED ORDER — DESVENLAFAXINE SUCCINATE ER 100 MG PO TB24
100.0000 mg | ORAL_TABLET | Freq: Every day | ORAL | 2 refills | Status: DC
  Filled 2021-07-02: qty 30, 30d supply, fill #0
  Filled 2021-07-31 – 2021-08-01 (×2): qty 30, 30d supply, fill #1
  Filled 2021-09-10: qty 30, 30d supply, fill #2

## 2021-07-02 MED ORDER — AMPHETAMINE-DEXTROAMPHET ER 15 MG PO CP24
15.0000 mg | ORAL_CAPSULE | Freq: Every morning | ORAL | 0 refills | Status: DC
  Filled 2021-07-31: qty 30, 30d supply, fill #0

## 2021-07-04 ENCOUNTER — Other Ambulatory Visit (HOSPITAL_COMMUNITY): Payer: Self-pay

## 2021-07-09 ENCOUNTER — Other Ambulatory Visit (HOSPITAL_COMMUNITY): Payer: Self-pay

## 2021-07-25 DIAGNOSIS — Z6826 Body mass index (BMI) 26.0-26.9, adult: Secondary | ICD-10-CM | POA: Diagnosis not present

## 2021-07-25 DIAGNOSIS — Z79899 Other long term (current) drug therapy: Secondary | ICD-10-CM | POA: Diagnosis not present

## 2021-07-25 DIAGNOSIS — R768 Other specified abnormal immunological findings in serum: Secondary | ICD-10-CM | POA: Diagnosis not present

## 2021-07-25 DIAGNOSIS — E663 Overweight: Secondary | ICD-10-CM | POA: Diagnosis not present

## 2021-07-31 ENCOUNTER — Other Ambulatory Visit (HOSPITAL_COMMUNITY): Payer: Self-pay

## 2021-07-31 MED ORDER — HYDROXYCHLOROQUINE SULFATE 200 MG PO TABS
200.0000 mg | ORAL_TABLET | Freq: Every day | ORAL | 0 refills | Status: DC
  Filled 2021-07-31: qty 90, 90d supply, fill #0

## 2021-08-01 ENCOUNTER — Other Ambulatory Visit (HOSPITAL_COMMUNITY): Payer: Self-pay

## 2021-08-02 ENCOUNTER — Other Ambulatory Visit (HOSPITAL_COMMUNITY): Payer: Self-pay

## 2021-08-21 DIAGNOSIS — H52223 Regular astigmatism, bilateral: Secondary | ICD-10-CM | POA: Diagnosis not present

## 2021-08-26 ENCOUNTER — Other Ambulatory Visit (HOSPITAL_COMMUNITY): Payer: Self-pay

## 2021-08-27 ENCOUNTER — Other Ambulatory Visit (HOSPITAL_COMMUNITY): Payer: Self-pay

## 2021-08-27 MED ORDER — LEVONORGESTREL-ETHINYL ESTRAD 0.15-30 MG-MCG PO TABS
1.0000 | ORAL_TABLET | Freq: Every day | ORAL | 0 refills | Status: DC
  Filled 2021-08-27: qty 112, 84d supply, fill #0

## 2021-09-06 ENCOUNTER — Other Ambulatory Visit (HOSPITAL_COMMUNITY): Payer: Self-pay

## 2021-09-06 DIAGNOSIS — Z01419 Encounter for gynecological examination (general) (routine) without abnormal findings: Secondary | ICD-10-CM | POA: Diagnosis not present

## 2021-09-06 DIAGNOSIS — Z3041 Encounter for surveillance of contraceptive pills: Secondary | ICD-10-CM | POA: Diagnosis not present

## 2021-09-06 DIAGNOSIS — N944 Primary dysmenorrhea: Secondary | ICD-10-CM | POA: Diagnosis not present

## 2021-09-06 DIAGNOSIS — Z130 Encounter for screening for diseases of the blood and blood-forming organs and certain disorders involving the immune mechanism: Secondary | ICD-10-CM | POA: Diagnosis not present

## 2021-09-06 DIAGNOSIS — Z124 Encounter for screening for malignant neoplasm of cervix: Secondary | ICD-10-CM | POA: Diagnosis not present

## 2021-09-06 DIAGNOSIS — Z113 Encounter for screening for infections with a predominantly sexual mode of transmission: Secondary | ICD-10-CM | POA: Diagnosis not present

## 2021-09-06 DIAGNOSIS — Z1151 Encounter for screening for human papillomavirus (HPV): Secondary | ICD-10-CM | POA: Diagnosis not present

## 2021-09-06 DIAGNOSIS — Z1389 Encounter for screening for other disorder: Secondary | ICD-10-CM | POA: Diagnosis not present

## 2021-09-06 MED ORDER — LEVONORGESTREL-ETHINYL ESTRAD 0.15-30 MG-MCG PO TABS
1.0000 | ORAL_TABLET | Freq: Every day | ORAL | 5 refills | Status: AC
  Filled 2021-09-06 – 2021-10-16 (×2): qty 84, 84d supply, fill #0
  Filled 2021-11-06: qty 112, 84d supply, fill #0
  Filled 2022-02-11: qty 112, 84d supply, fill #1
  Filled 2022-05-29: qty 112, 84d supply, fill #2
  Filled 2022-09-03: qty 84, 84d supply, fill #3

## 2021-09-10 ENCOUNTER — Other Ambulatory Visit (HOSPITAL_COMMUNITY): Payer: Self-pay

## 2021-09-21 ENCOUNTER — Other Ambulatory Visit (HOSPITAL_COMMUNITY): Payer: Self-pay

## 2021-09-21 DIAGNOSIS — F321 Major depressive disorder, single episode, moderate: Secondary | ICD-10-CM | POA: Diagnosis not present

## 2021-09-21 DIAGNOSIS — F902 Attention-deficit hyperactivity disorder, combined type: Secondary | ICD-10-CM | POA: Diagnosis not present

## 2021-09-21 DIAGNOSIS — F411 Generalized anxiety disorder: Secondary | ICD-10-CM | POA: Diagnosis not present

## 2021-09-21 MED ORDER — AMPHETAMINE-DEXTROAMPHET ER 15 MG PO CP24
15.0000 mg | ORAL_CAPSULE | Freq: Every morning | ORAL | 0 refills | Status: DC
  Filled 2022-01-25: qty 30, 30d supply, fill #0

## 2021-09-21 MED ORDER — AMPHETAMINE-DEXTROAMPHET ER 15 MG PO CP24
15.0000 mg | ORAL_CAPSULE | Freq: Every morning | ORAL | 0 refills | Status: DC
  Filled 2021-11-05: qty 30, 30d supply, fill #0

## 2021-09-21 MED ORDER — AMPHETAMINE-DEXTROAMPHET ER 15 MG PO CP24
15.0000 mg | ORAL_CAPSULE | Freq: Every morning | ORAL | 0 refills | Status: DC
  Filled 2021-10-05: qty 30, 30d supply, fill #0

## 2021-09-21 MED ORDER — DESVENLAFAXINE SUCCINATE ER 100 MG PO TB24
100.0000 mg | ORAL_TABLET | Freq: Every day | ORAL | 2 refills | Status: DC
  Filled 2021-09-21 – 2021-10-12 (×2): qty 30, 30d supply, fill #0
  Filled 2021-12-12: qty 30, 30d supply, fill #1
  Filled 2022-01-25: qty 30, 30d supply, fill #2

## 2021-10-05 ENCOUNTER — Other Ambulatory Visit (HOSPITAL_COMMUNITY): Payer: Self-pay

## 2021-10-12 ENCOUNTER — Other Ambulatory Visit (HOSPITAL_COMMUNITY): Payer: Self-pay

## 2021-10-12 DIAGNOSIS — F902 Attention-deficit hyperactivity disorder, combined type: Secondary | ICD-10-CM | POA: Diagnosis not present

## 2021-10-12 DIAGNOSIS — F411 Generalized anxiety disorder: Secondary | ICD-10-CM | POA: Diagnosis not present

## 2021-10-12 DIAGNOSIS — F321 Major depressive disorder, single episode, moderate: Secondary | ICD-10-CM | POA: Diagnosis not present

## 2021-10-12 MED ORDER — PROPRANOLOL HCL 10 MG PO TABS
5.0000 mg | ORAL_TABLET | Freq: Every day | ORAL | 0 refills | Status: DC | PRN
  Filled 2021-10-12: qty 30, 30d supply, fill #0

## 2021-10-16 ENCOUNTER — Other Ambulatory Visit (HOSPITAL_COMMUNITY): Payer: Self-pay

## 2021-11-05 ENCOUNTER — Other Ambulatory Visit (HOSPITAL_COMMUNITY): Payer: Self-pay

## 2021-11-06 ENCOUNTER — Other Ambulatory Visit (HOSPITAL_COMMUNITY): Payer: Self-pay

## 2021-11-07 ENCOUNTER — Other Ambulatory Visit (HOSPITAL_COMMUNITY): Payer: Self-pay

## 2021-11-18 ENCOUNTER — Other Ambulatory Visit (HOSPITAL_COMMUNITY): Payer: Self-pay

## 2021-11-19 ENCOUNTER — Other Ambulatory Visit (HOSPITAL_COMMUNITY): Payer: Self-pay

## 2021-11-19 MED ORDER — HYDROXYCHLOROQUINE SULFATE 200 MG PO TABS
200.0000 mg | ORAL_TABLET | Freq: Every day | ORAL | 1 refills | Status: DC
  Filled 2021-11-19: qty 90, 90d supply, fill #0
  Filled 2022-02-11: qty 90, 90d supply, fill #1

## 2021-12-12 ENCOUNTER — Other Ambulatory Visit (HOSPITAL_COMMUNITY): Payer: Self-pay

## 2022-01-11 ENCOUNTER — Other Ambulatory Visit (HOSPITAL_COMMUNITY): Payer: Self-pay

## 2022-01-23 DIAGNOSIS — R768 Other specified abnormal immunological findings in serum: Secondary | ICD-10-CM | POA: Diagnosis not present

## 2022-01-23 DIAGNOSIS — Z6829 Body mass index (BMI) 29.0-29.9, adult: Secondary | ICD-10-CM | POA: Diagnosis not present

## 2022-01-23 DIAGNOSIS — E663 Overweight: Secondary | ICD-10-CM | POA: Diagnosis not present

## 2022-01-23 DIAGNOSIS — M0579 Rheumatoid arthritis with rheumatoid factor of multiple sites without organ or systems involvement: Secondary | ICD-10-CM | POA: Diagnosis not present

## 2022-01-23 DIAGNOSIS — Z79899 Other long term (current) drug therapy: Secondary | ICD-10-CM | POA: Diagnosis not present

## 2022-01-25 ENCOUNTER — Other Ambulatory Visit (HOSPITAL_COMMUNITY): Payer: Self-pay

## 2022-01-25 MED ORDER — DOXYCYCLINE HYCLATE 100 MG PO TABS
100.0000 mg | ORAL_TABLET | Freq: Every day | ORAL | 6 refills | Status: AC
  Filled 2022-01-25: qty 90, 90d supply, fill #0
  Filled 2022-04-23: qty 90, 90d supply, fill #1

## 2022-01-30 ENCOUNTER — Other Ambulatory Visit (HOSPITAL_COMMUNITY): Payer: Self-pay

## 2022-01-30 DIAGNOSIS — F902 Attention-deficit hyperactivity disorder, combined type: Secondary | ICD-10-CM | POA: Diagnosis not present

## 2022-01-30 DIAGNOSIS — F321 Major depressive disorder, single episode, moderate: Secondary | ICD-10-CM | POA: Diagnosis not present

## 2022-01-30 DIAGNOSIS — F411 Generalized anxiety disorder: Secondary | ICD-10-CM | POA: Diagnosis not present

## 2022-01-30 MED ORDER — AMPHETAMINE-DEXTROAMPHET ER 15 MG PO CP24: 15.0000 mg | ORAL_CAPSULE | Freq: Every morning | ORAL | 0 refills | Status: DC

## 2022-01-30 MED ORDER — DESVENLAFAXINE SUCCINATE ER 100 MG PO TB24
100.0000 mg | ORAL_TABLET | Freq: Every day | ORAL | 0 refills | Status: DC
  Filled 2022-01-30 – 2022-02-18 (×2): qty 90, 90d supply, fill #0

## 2022-01-30 MED ORDER — AMPHETAMINE-DEXTROAMPHET ER 15 MG PO CP24
15.0000 mg | ORAL_CAPSULE | Freq: Every morning | ORAL | 0 refills | Status: DC
  Filled 2022-01-30 – 2022-02-25 (×5): qty 30, 30d supply, fill #0

## 2022-02-11 ENCOUNTER — Other Ambulatory Visit (HOSPITAL_COMMUNITY): Payer: Self-pay

## 2022-02-18 ENCOUNTER — Other Ambulatory Visit (HOSPITAL_COMMUNITY): Payer: Self-pay

## 2022-02-19 ENCOUNTER — Other Ambulatory Visit (HOSPITAL_COMMUNITY): Payer: Self-pay

## 2022-02-21 ENCOUNTER — Other Ambulatory Visit (HOSPITAL_COMMUNITY): Payer: Self-pay

## 2022-02-25 ENCOUNTER — Other Ambulatory Visit (HOSPITAL_COMMUNITY): Payer: Self-pay

## 2022-03-01 DIAGNOSIS — Z111 Encounter for screening for respiratory tuberculosis: Secondary | ICD-10-CM | POA: Diagnosis not present

## 2022-03-04 DIAGNOSIS — Z0279 Encounter for issue of other medical certificate: Secondary | ICD-10-CM | POA: Diagnosis not present

## 2022-03-08 ENCOUNTER — Other Ambulatory Visit (HOSPITAL_COMMUNITY): Payer: Self-pay

## 2022-03-08 DIAGNOSIS — F902 Attention-deficit hyperactivity disorder, combined type: Secondary | ICD-10-CM | POA: Diagnosis not present

## 2022-03-08 DIAGNOSIS — F411 Generalized anxiety disorder: Secondary | ICD-10-CM | POA: Diagnosis not present

## 2022-03-08 DIAGNOSIS — F321 Major depressive disorder, single episode, moderate: Secondary | ICD-10-CM | POA: Diagnosis not present

## 2022-03-08 MED ORDER — DESVENLAFAXINE SUCCINATE ER 100 MG PO TB24: 100.0000 mg | ORAL_TABLET | Freq: Every day | ORAL | 0 refills | Status: DC

## 2022-03-08 MED ORDER — AMPHETAMINE-DEXTROAMPHET ER 25 MG PO CP24
25.0000 mg | ORAL_CAPSULE | Freq: Every morning | ORAL | 0 refills | Status: DC
  Filled 2022-03-08: qty 30, 30d supply, fill #0

## 2022-03-13 ENCOUNTER — Other Ambulatory Visit (HOSPITAL_COMMUNITY): Payer: Self-pay

## 2022-04-24 ENCOUNTER — Other Ambulatory Visit (HOSPITAL_COMMUNITY): Payer: Self-pay

## 2022-04-24 ENCOUNTER — Other Ambulatory Visit: Payer: Self-pay

## 2022-05-07 ENCOUNTER — Other Ambulatory Visit (HOSPITAL_COMMUNITY): Payer: Self-pay

## 2022-05-14 ENCOUNTER — Other Ambulatory Visit (HOSPITAL_COMMUNITY): Payer: Self-pay

## 2022-05-14 DIAGNOSIS — F321 Major depressive disorder, single episode, moderate: Secondary | ICD-10-CM | POA: Diagnosis not present

## 2022-05-14 DIAGNOSIS — F902 Attention-deficit hyperactivity disorder, combined type: Secondary | ICD-10-CM | POA: Diagnosis not present

## 2022-05-14 DIAGNOSIS — F411 Generalized anxiety disorder: Secondary | ICD-10-CM | POA: Diagnosis not present

## 2022-05-14 MED ORDER — DESVENLAFAXINE SUCCINATE ER 100 MG PO TB24
100.0000 mg | ORAL_TABLET | Freq: Every day | ORAL | 0 refills | Status: DC
  Filled 2022-05-14: qty 90, 90d supply, fill #0

## 2022-05-14 MED ORDER — AMPHETAMINE-DEXTROAMPHET ER 25 MG PO CP24
25.0000 mg | ORAL_CAPSULE | Freq: Every morning | ORAL | 0 refills | Status: DC
  Filled 2022-07-04: qty 20, 20d supply, fill #0
  Filled 2022-07-04: qty 10, 10d supply, fill #0

## 2022-05-14 MED ORDER — AMPHETAMINE-DEXTROAMPHET ER 25 MG PO CP24
25.0000 mg | ORAL_CAPSULE | Freq: Every morning | ORAL | 0 refills | Status: DC
  Filled 2022-05-14: qty 30, 30d supply, fill #0

## 2022-05-14 MED ORDER — AMPHETAMINE-DEXTROAMPHET ER 25 MG PO CP24
25.0000 mg | ORAL_CAPSULE | Freq: Every morning | ORAL | 0 refills | Status: AC
  Filled 2022-08-02 (×2): qty 30, 30d supply, fill #0

## 2022-05-14 MED ORDER — PROPRANOLOL HCL 10 MG PO TABS
ORAL_TABLET | ORAL | 1 refills | Status: DC
  Filled 2022-05-14: qty 30, 30d supply, fill #0

## 2022-05-15 ENCOUNTER — Other Ambulatory Visit (HOSPITAL_COMMUNITY): Payer: Self-pay

## 2022-05-21 ENCOUNTER — Other Ambulatory Visit (HOSPITAL_COMMUNITY): Payer: Self-pay

## 2022-05-21 MED ORDER — HYDROXYCHLOROQUINE SULFATE 200 MG PO TABS
200.0000 mg | ORAL_TABLET | Freq: Every day | ORAL | 1 refills | Status: DC
  Filled 2022-05-21 – 2022-08-02 (×2): qty 90, 90d supply, fill #0
  Filled 2022-08-02: qty 90, 90d supply, fill #1

## 2022-06-14 ENCOUNTER — Ambulatory Visit (INDEPENDENT_AMBULATORY_CARE_PROVIDER_SITE_OTHER): Payer: Commercial Managed Care - PPO

## 2022-06-14 ENCOUNTER — Encounter (HOSPITAL_COMMUNITY): Payer: Self-pay | Admitting: Emergency Medicine

## 2022-06-14 ENCOUNTER — Ambulatory Visit (HOSPITAL_COMMUNITY)
Admission: EM | Admit: 2022-06-14 | Discharge: 2022-06-14 | Disposition: A | Discharge status: Home / Self Care | Payer: Commercial Managed Care - PPO | Attending: Emergency Medicine | Admitting: Emergency Medicine

## 2022-06-14 DIAGNOSIS — S92325A Nondisplaced fracture of second metatarsal bone, left foot, initial encounter for closed fracture: Secondary | ICD-10-CM | POA: Diagnosis not present

## 2022-06-14 DIAGNOSIS — M7989 Other specified soft tissue disorders: Secondary | ICD-10-CM | POA: Diagnosis not present

## 2022-06-14 HISTORY — DX: Plantar fascial fibromatosis: M72.2

## 2022-06-14 NOTE — ED Provider Notes (Signed)
[facility]    CSN: [id number] Arrival date & time: 06/14/22  1157      History   Chief Complaint Chief Complaint  Patient presents with   Foot Pain    HPI SERENITIE KAMEI is a 22 y.o. adult.   Patient presents to clinic over left foot pain and swelling that has been ongoing over this past several weeks.  She works at the [employer] and stands for long periods of time.  She recently got new boots to help support her feet.  When she is not at work, she wears tennis shoes with inserts.  Reports a history of plantars fasciitis.  Has been taking ibuprofen and icing without much relief.  Pain and swelling increases when standing.  She worked this week Monday through Thursday.  Denies any falls, ankle pain twisting or trauma.   The history is provided by the patient and medical records.  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 and distinct from POTS     Patient Active Problem List   Diagnosis Date Noted   Chronic migraine without aura without status migrainosus, not intractable 07/27/2020   Nutritional counseling 03/31/2020   POTS (postural orthostatic tachycardia syndrome) 01/07/2020   SVT (supraventricular tachycardia) 01/07/2020   Generalized anxiety disorder 10/21/2017   Attention deficit hyperactivity disorder (ADHD), predominantly inattentive type 10/21/2017   Persistent depressive disorder with atypical features, currently mild 10/21/2017   Specific learning disorder, 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      Home Medications    Prior to Admission  medications   Medication Sig Start Date End Date Taking? Authorizing Provider  ADDERALL XR 20 MG 24 hr capsule Take 1 capsule by mouth every morning 05/19/20     amphetamine-dextroamphetamine (ADDERALL XR) 15 MG 24 hr capsule Take 1 capsule by mouth every morning. 10/05/20     amphetamine-dextroamphetamine (ADDERALL XR) 15 MG 24 hr capsule Take 1 capsule by mouth every morning. 09/06/20     amphetamine-dextroamphetamine (ADDERALL XR) 15 MG 24 hr capsule Take 1 capsule by mouth every morning. 11/07/20     amphetamine-dextroamphetamine (ADDERALL XR) 15 MG 24 hr capsule Take 1 capsule by mouth every morning. 01/03/21     amphetamine-dextroamphetamine (ADDERALL XR) 15 MG 24 hr capsule Take 1 capsule by mouth every morning. 02/01/21     amphetamine-dextroamphetamine (ADDERALL XR) 15 MG 24 hr capsule Take 1 capsule by mouth every morning. 03/05/21     amphetamine-dextroamphetamine (ADDERALL XR) 15 MG 24 hr capsule Take 1 capsule by mouth every morning. 04/03/21     amphetamine-dextroamphetamine (ADDERALL XR) 15 MG 24 hr capsule Take 1 capsule by mouth every morning. 05/31/21     amphetamine-dextroamphetamine (ADDERALL XR) 15 MG 24 hr capsule Take 1 capsule by mouth every morning. 05/02/21     amphetamine-dextroamphetamine (ADDERALL XR) 15 MG 24 hr capsule Take 1 capsule by mouth every morning. 07/02/21     amphetamine-dextroamphetamine (ADDERALL XR) 15 MG 24 hr capsule Take 1 capsule by mouth every morning. 07/31/21     amphetamine-dextroamphetamine (  ADDERALL XR) 15 MG 24 hr capsule Take 1 capsule by mouth every morning. (08-29-21) 08/29/21     amphetamine-dextroamphetamine (ADDERALL XR) 15 MG 24 hr capsule Take 1 capsule by mouth every morning. DNFB 10/26/21 10/26/21     amphetamine-dextroamphetamine (ADDERALL XR) 15 MG 24 hr capsule Take 1 capsule by mouth every morning. 09/27/21     amphetamine-dextroamphetamine (ADDERALL XR) 15 MG 24 hr capsule Take 1 capsule by mouth every morning. 01/30/22      amphetamine-dextroamphetamine (ADDERALL XR) 20 MG 24 hr capsule Take 1 capsule (20 mg total) by mouth every morning. 03/22/20   Thedore Mins, MD  amphetamine-dextroamphetamine (ADDERALL XR) 20 MG 24 hr capsule Take 1 capsule by mouth every morning 06/18/20     amphetamine-dextroamphetamine (ADDERALL XR) 20 MG 24 hr capsule Take 1 capsule by mouth every morning 08/16/20     amphetamine-dextroamphetamine (ADDERALL XR) 20 MG 24 hr capsule Take 1 capsule by mouth every morning 07/18/20     amphetamine-dextroamphetamine (ADDERALL XR) 25 MG 24 hr capsule Take 1 capsule by mouth every morning. 05/14/22     amphetamine-dextroamphetamine (ADDERALL XR) 25 MG 24 hr capsule Take 1 capsule by mouth every morning. 07/13/22 07/13/22     amphetamine-dextroamphetamine (ADDERALL XR) 25 MG 24 hr capsule Take 1 capsule by mouth every morning. 06/13/22 06/13/22     baclofen (LIORESAL) 10 MG tablet Take 10 mg by mouth as needed. 02/01/19   [provider]  desvenlafaxine (PRISTIQ) 100 MG 24 hr tablet Take 1 tablet by mouth daily 05/19/20     desvenlafaxine (PRISTIQ) 100 MG 24 hr tablet Take 1 tablet (100 mg total) by mouth daily after lunch or dinner 07/18/20     desvenlafaxine (PRISTIQ) 100 MG 24 hr tablet Take 1 tablet (100 mg total) by mouth daily after lunch or dinner. 09/06/20     desvenlafaxine (PRISTIQ) 100 MG 24 hr tablet Take 1 tablet (100 mg total) by mouth daily after lunch or dinner 11/02/20     desvenlafaxine (PRISTIQ) 100 MG 24 hr tablet Take 1 tablet (100 mg total) by mouth daily after lunch or dinner 01/03/21     desvenlafaxine (PRISTIQ) 100 MG 24 hr tablet Take 1 tablet (100 mg total) by mouth daily after lunch or dinner. 03/05/21     desvenlafaxine (PRISTIQ) 100 MG 24 hr tablet Take 1 tablet (100 mg total) by mouth daily after lunch or dinner 05/02/21     desvenlafaxine (PRISTIQ) 100 MG 24 hr tablet Take 1 tablet (100 mg total) by mouth daily after lunch or dinner 07/02/21     desvenlafaxine (PRISTIQ)  100 MG 24 hr tablet Take 1 tablet (100 mg total) by mouth daily after lunch or dinner 09/21/21     desvenlafaxine (PRISTIQ) 100 MG 24 hr tablet Take 1 tablet (100 mg total) by mouth daily after lunch or dinner. 03/08/22     desvenlafaxine (PRISTIQ) 100 MG 24 hr tablet Take 1 tablet (100 mg total) by mouth daily after lunch or dinner. 05/14/22     doxycycline (VIBRA-TABS) 100 MG tablet Take 1 tablet (100 mg total) by mouth daily. 01/24/22     fish oil-omega-3 fatty acids 1000 MG capsule Take 1 g by mouth daily.    [provider]  Galcanezumab-gnlm (EMGALITY) 120 MG/ML SOAJ Inject 240 mg into the skin as directed AND 120 mg every 30 (thirty) days. Inj 240mg  once then 120mg  monthly. 07/26/20   Glyn Ade, Scot Jun, PA-C  hydroxychloroquine (PLAQUENIL) 200 MG tablet Take 1 tablet (  200 mg total) by mouth daily with food or milk 05/21/22     levonorgestrel-ethinyl estradiol (MARLISSA) 0.15-30 MG-MCG tablet Take 1 tablet by mouth daily as directed for long cycle. take continuously. 09/06/21     levonorgestrel-ethinyl estradiol (NORDETTE) 0.15-30 MG-MCG tablet Take 1 tablet by mouth daily as directed for long cycle 04/25/20     levonorgestrel-ethinyl estradiol (NORDETTE) 0.15-30 MG-MCG tablet Take 1 tablet by mouth daily. 08/08/20     MARLISSA 0.15-30 MG-MCG tablet Take 1 tablet by mouth daily. 05/19/19   [provider]  MARLISSA 0.15-30 MG-MCG tablet TAKE 1 TABLET BY MOUTH ONCE DAILY AS DIRECTED FOR LONG CYCLE 02/16/20 02/15/21  Key, Verita Schneiders, NP  naproxen (NAPROSYN) 500 MG tablet Take 1 tablet (500 mg total) by mouth 2 (two) times daily. 06/19/19   Wieters, Hallie C, PA-C  zonisamide (ZONEGRAN) 100 MG capsule TAKE 2 CAPSULES BY MOUTH DAILY AS DIRECTED 10/28/19 10/27/20  Santiago Glad, MD  buPROPion (WELLBUTRIN XL) 150 MG 24 hr tablet Take 1 tablet (150 mg total) by mouth daily after breakfast. 01/11/19 06/19/19  Chauncey Mann, MD  cetirizine (ZYRTEC) 5 MG chewable tablet Chew 5 mg by mouth daily.   06/19/19  [provider]  escitalopram (LEXAPRO) 10 MG tablet Take 1 tablet (10 mg total) by mouth at bedtime. 01/11/19 06/19/19  Chauncey Mann, MD  montelukast (SINGULAIR) 4 MG chewable tablet Chew 4 mg by mouth at bedtime.  06/19/19  [provider]    Family History No family history on file.  Social History Social History   Tobacco Use   Smoking status: Never   Smokeless tobacco: Never  Substance Use Topics   Alcohol use: Not Currently   Drug use: Not Currently     Allergies   Patient has no known allergies.   Review of Systems Review of Systems  Musculoskeletal:  Positive for arthralgias.  Skin:  Negative for color change, pallor, rash and wound.     Physical Exam Triage Vital Signs ED Triage Vitals  Enc Vitals Group     BP 06/14/22 1252 115/75     Pulse Rate 06/14/22 1252 94     Resp 06/14/22 1252 16     Temp 06/14/22 1252 97.9 F (36.6 C)     Temp Source 06/14/22 1252 Oral     SpO2 06/14/22 1252 99 %     Weight --      Height --      Head Circumference --      Peak Flow --      Pain Score 06/14/22 1250 6     Pain Loc --      Pain Edu? --      Excl. in GC? --    No data found.  Updated Vital Signs BP 115/75 (BP Location: Right Arm)   Pulse 94   Temp 97.9 F (36.6 C) (Oral)   Resp 16   SpO2 99%   Visual Acuity Right Eye Distance:   Left Eye Distance:   Bilateral Distance:    Right Eye Near:   Left Eye Near:    Bilateral Near:     Physical Exam Vitals and nursing note reviewed.  Constitutional:      Appearance: Normal appearance.  HENT:     Head: Normocephalic and atraumatic.     Right Ear: External ear normal.     Left Ear: External ear normal.     Nose: Nose normal.     Mouth/Throat:  Mouth: Mucous membranes are moist.  Eyes:     Conjunctiva/sclera: Conjunctivae normal.  Cardiovascular:     Rate and Rhythm: Normal rate.     Pulses:          Dorsalis pedis pulses are 2+ on the left side.       Posterior  tibial pulses are 2+ on the left side.  Pulmonary:     Effort: Pulmonary effort is normal. No respiratory distress.  Musculoskeletal:        General: Swelling and tenderness present. No deformity or signs of injury. Normal range of motion.       Feet:  Feet:     Left foot:     Skin integrity: Skin integrity normal.     Comments: TTP at second and third lesser metatarsals with associated swelling.  Without ecchymosis, laceration or bruising. Without warmth, erythema or streaking.  Skin:    General: Skin is warm and dry.     Capillary Refill: Capillary refill takes less than 2 seconds.  Neurological:     General: No focal deficit present.     Mental Status: MARLEA VERZOSA is alert and oriented to person, place, and time.  Psychiatric:        Mood and Affect: Mood normal.        Behavior: Behavior is cooperative.      UC Treatments / Results  Labs (all labs ordered are listed, but only abnormal results are displayed) Labs Reviewed - No data to display  EKG   Radiology DG Foot Complete Left  Result Date: 06/14/2022 CLINICAL DATA:  Pain and swelling EXAM: LEFT FOOT - COMPLETE 3+ VIEW COMPARISON:  None Available. FINDINGS: There is a break in the medial cortical margin of distal shaft of second metatarsal. There is no significant displacement. Rest of the bony structures are unremarkable. Undisplaced fracture is seen in the distal shaft of left second metatarsal. There is soft tissue swelling over the dorsum. IMPRESSION: Essentially undisplaced fracture is seen in the distal shaft of left second metatarsal. Electronically Signed   By: Ernie Avena M.D.   On: 06/14/2022 13:50    Procedures Procedures (including critical care time)  Medications Ordered in UC Medications - No data to display  Initial Impression / Assessment and Plan / UC Course  I have reviewed the triage vital signs and the nursing notes.  Pertinent labs & imaging results that were available during my  care of the patient were reviewed by me and considered in my medical decision making (see chart for details).  Vitals and triage reviewed, patient is hemodynamically stable.  Left foot with swelling and discomfort, TTP over distal second and third metatarsal.  Imaging significant for non-displaced fx of the distal shaft of left second metatarsal.  Given postop shoe in clinic, advised to rest, ice, elevation and anti-inflammatories.  Given information for orthopedic follow-up within the next week.  Plan of care, follow-up care and return precautions reviewed, no questions at this time.     Final Clinical Impressions(s) / UC Diagnoses   Final diagnoses:  Closed nondisplaced fracture of second metatarsal bone of left foot, initial encounter     Discharge Instructions      You have a fracture of your second toe on your left foot, this explains your swelling.  Please rest, ice and elevate your extremity.  You can take 800 mg of Ibuprofen every 8 hours for pain and swelling.  I advise following up with [facility]  within the next week or so to ensure proper healing and improvement.  You can wear the postop shoe as needed for comfort.  Return to clinic if you have any new or concerning symptoms.     ED Prescriptions   None    PDMP not reviewed this encounter.   Jeily Guthridge, [place], [place] 06/14/22 1415

## 2022-06-14 NOTE — Discharge Instructions (Addendum)
You have a fracture of your second toe on your left foot, this explains your swelling.  Please rest, ice and elevate your extremity.  You can take 800 mg of Ibuprofen every 8 hours for pain and swelling.  I advise following up with [facility] within the next week or so to ensure proper healing and improvement.  You can wear the postop shoe as needed for comfort.  Return to clinic if you have any new or concerning symptoms.

## 2022-06-14 NOTE — ED Triage Notes (Signed)
Pt reports left foot pain and swelling that is intermittent over several weeks. Reports working at a [employer] and standing for long periods of time which makes swelling worse. Reports hx plantar fascitis but pain more top and around toes. Taking ibuprofen and icing.

## 2022-06-20 ENCOUNTER — Ambulatory Visit (INDEPENDENT_AMBULATORY_CARE_PROVIDER_SITE_OTHER): Payer: Commercial Managed Care - PPO | Admitting: Sports Medicine

## 2022-06-20 VITALS — BP 126/80 | Ht 66.0 in

## 2022-06-20 DIAGNOSIS — S92302A Fracture of unspecified metatarsal bone(s), left foot, initial encounter for closed fracture: Secondary | ICD-10-CM

## 2022-06-20 DIAGNOSIS — M25572 Pain in left ankle and joints of left foot: Secondary | ICD-10-CM | POA: Diagnosis not present

## 2022-06-20 NOTE — Progress Notes (Signed)
   Subjective:    Patient ID: Rachel Little, adult    DOB: Apr 04, 2000, 22 y.o.   MRN: [id number]  HPI chief complaint: Left foot pain  Patient is a very pleasant 22 year old  that presents today after having suffered an injury to the left foot.  Initial injury occurred when moving out of her dorm a couple of weeks ago.  Pain intensified after falling down some stairs last week.  Swelling also developed.  Evaluation was completed at a local urgent care and x-rays were obtained which showed a nondisplaced second metatarsal shaft fracture.  Immobilization in a postop shoe.  Pain is localized to the dorsum of the foot.  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  Past medical history reviewed Medications reviewed Allergies reviewed    Review of Systems As above    Objective:   Physical Exam  Well-developed, well-nourished.  No acute distress  Left foot: There is tenderness to palpation along the second metatarsal shaft on the dorsum of the foot.  There is diffuse soft tissue swelling across the foot as well.  No skin breakdown.  Good pulses.  X-rays of the left foot including AP, lateral, and oblique views are reviewed.  There is a nondisplaced fracture through the distal shaft of the left second metatarsal.      Assessment & Plan:   1 week status post left second metatarsal shaft fracture  Patient will remain in her postop shoe for 2 additional weeks.  Follow-up with me at the end of that time for reevaluation and repeat x-rays.  We will determine at that time whether or not to transition into a hard soled boot or shoe.  Call with questions or concerns in the interim.  This note was dictated using Dragon naturally speaking software and may contain errors in syntax, spelling, or content which have not been identified prior to signing this note.

## 2022-07-04 ENCOUNTER — Other Ambulatory Visit (HOSPITAL_COMMUNITY): Payer: Self-pay

## 2022-07-04 ENCOUNTER — Ambulatory Visit
Admission: RE | Admit: 2022-07-04 | Discharge: 2022-07-04 | Disposition: A | Discharge status: Home / Self Care | Payer: Commercial Managed Care - PPO | Source: Ambulatory Visit | Attending: Sports Medicine | Admitting: Sports Medicine

## 2022-07-04 ENCOUNTER — Ambulatory Visit: Payer: Commercial Managed Care - PPO | Admitting: Sports Medicine

## 2022-07-04 VITALS — BP 120/80 | Ht 66.0 in

## 2022-07-04 DIAGNOSIS — S92302D Fracture of unspecified metatarsal bone(s), left foot, subsequent encounter for fracture with routine healing: Secondary | ICD-10-CM

## 2022-07-04 DIAGNOSIS — Z4789 Encounter for other orthopedic aftercare: Secondary | ICD-10-CM | POA: Diagnosis not present

## 2022-07-04 DIAGNOSIS — M25572 Pain in left ankle and joints of left foot: Secondary | ICD-10-CM

## 2022-07-04 NOTE — Progress Notes (Signed)
   Subjective:    Patient ID: Rachel Little, adult    DOB: 05/29/2000, 21 y.o.   MRN: [id number]  HPI Patient presents today for follow-up on a nondisplaced second metatarsal shaft fracture of the left foot.  Doing well.  Tolerating the postop shoe.  There is an additional postop shoe that is worn at work.  Injury is now 82 weeks old.   Review of Systems As above    Objective:   Physical Exam   Well-developed, well-nourished.  Left foot: Previous soft tissue swelling has improved.  There is still tenderness to palpation along the second metatarsal shaft.  Good pulses.  X-rays of the left foot including AP, lateral, and oblique views show abundant callus surrounding the fracture site     Assessment & Plan:   3 weeks status post healing nondisplaced second metatarsal shaft fracture, left foot  We will plan on fitting work boots with a steel shank.  They may decide between that and their postop shoe at work.  Away from work, she will continue in the postop shoe for an additional 2 to 3 weeks.  Follow-up with me on July 9 for what I anticipate to be the final office visit.  We will repeat x-rays at that time as well.  This note was dictated using Dragon naturally speaking software and may contain errors in syntax, spelling, or content which have not been identified prior to signing this note.

## 2022-07-30 DIAGNOSIS — R768 Other specified abnormal immunological findings in serum: Secondary | ICD-10-CM | POA: Diagnosis not present

## 2022-07-30 DIAGNOSIS — E663 Overweight: Secondary | ICD-10-CM | POA: Diagnosis not present

## 2022-07-30 DIAGNOSIS — Z79899 Other long term (current) drug therapy: Secondary | ICD-10-CM | POA: Diagnosis not present

## 2022-07-30 DIAGNOSIS — M0579 Rheumatoid arthritis with rheumatoid factor of multiple sites without organ or systems involvement: Secondary | ICD-10-CM | POA: Diagnosis not present

## 2022-07-30 DIAGNOSIS — Z6829 Body mass index (BMI) 29.0-29.9, adult: Secondary | ICD-10-CM | POA: Diagnosis not present

## 2022-07-31 ENCOUNTER — Ambulatory Visit (INDEPENDENT_AMBULATORY_CARE_PROVIDER_SITE_OTHER): Payer: Commercial Managed Care - PPO | Admitting: Sports Medicine

## 2022-07-31 ENCOUNTER — Ambulatory Visit
Admission: RE | Admit: 2022-07-31 | Discharge: 2022-07-31 | Disposition: A | Discharge status: Home / Self Care | Payer: Commercial Managed Care - PPO | Source: Ambulatory Visit | Attending: Sports Medicine | Admitting: Sports Medicine

## 2022-07-31 VITALS — BP 124/86 | Ht 66.0 in | Wt 180.0 lb

## 2022-07-31 DIAGNOSIS — S92302D Fracture of unspecified metatarsal bone(s), left foot, subsequent encounter for fracture with routine healing: Secondary | ICD-10-CM

## 2022-07-31 DIAGNOSIS — S92322D Displaced fracture of second metatarsal bone, left foot, subsequent encounter for fracture with routine healing: Secondary | ICD-10-CM | POA: Diagnosis not present

## 2022-07-31 NOTE — Progress Notes (Signed)
   Subjective:    Patient ID: Rachel Little, adult    DOB: [birth year], 21 y.o.   MRN: [id number]  HPI  Patient presents today for follow-up on a left foot metatarsal shaft fracture.  Doing well.  Minimal pain.  Still wearing a postop shoe and occasional steel shank in tennis shoes.    Review of Systems As above    Objective:   Physical Exam  Well-developed, well-nourished.  No acute distress  Left foot: There is palpable callus over the second metatarsal shaft.  No tenderness to palpation.  Negative metatarsal squeeze.  No swelling.  Good pulses.  Ambulating with a normal gait.  X-rays of the left foot including AP, lateral, and oblique view show abundant callus formation around the second metatarsal shaft fracture      Assessment & Plan:   7 weeks status post left second metatarsal shaft fracture  Patient may discontinue all forms of immobilization as symptoms allow.  Increase activity as tolerated and follow-up as needed.  This note was dictated using Dragon naturally speaking software and may contain errors in syntax, spelling, or content which have not been identified prior to signing this note.

## 2022-08-02 ENCOUNTER — Other Ambulatory Visit (HOSPITAL_COMMUNITY): Payer: Self-pay

## 2022-08-05 ENCOUNTER — Other Ambulatory Visit (HOSPITAL_COMMUNITY): Payer: Self-pay

## 2022-08-05 MED ORDER — DESVENLAFAXINE SUCCINATE ER 100 MG PO TB24
100.0000 mg | ORAL_TABLET | Freq: Every day | ORAL | 0 refills | Status: AC
  Filled 2022-08-05 – 2022-09-03 (×2): qty 30, 30d supply, fill #0

## 2022-08-05 MED ORDER — PROPRANOLOL HCL 10 MG PO TABS
ORAL_TABLET | ORAL | 0 refills | Status: AC
  Filled 2022-08-05: qty 30, 30d supply, fill #0

## 2022-08-08 ENCOUNTER — Other Ambulatory Visit (HOSPITAL_COMMUNITY): Payer: Self-pay

## 2022-08-08 DIAGNOSIS — F411 Generalized anxiety disorder: Secondary | ICD-10-CM | POA: Diagnosis not present

## 2022-08-08 DIAGNOSIS — F321 Major depressive disorder, single episode, moderate: Secondary | ICD-10-CM | POA: Diagnosis not present

## 2022-08-08 DIAGNOSIS — F902 Attention-deficit hyperactivity disorder, combined type: Secondary | ICD-10-CM | POA: Diagnosis not present

## 2022-08-08 MED ORDER — DESVENLAFAXINE SUCCINATE ER 100 MG PO TB24
100.0000 mg | ORAL_TABLET | Freq: Every day | ORAL | 0 refills | Status: DC
  Filled 2022-10-15 (×2): qty 90, 90d supply, fill #0

## 2022-08-08 MED ORDER — AMPHETAMINE-DEXTROAMPHET ER 20 MG PO CP24
20.0000 mg | ORAL_CAPSULE | Freq: Every morning | ORAL | 0 refills | Status: AC
  Filled 2022-08-08 – 2022-09-03 (×2): qty 30, 30d supply, fill #0

## 2022-08-08 MED ORDER — AMPHETAMINE-DEXTROAMPHET ER 20 MG PO CP24
20.0000 mg | ORAL_CAPSULE | ORAL | 0 refills | Status: AC
  Filled 2022-12-18: qty 30, 30d supply, fill #0

## 2022-08-08 MED ORDER — AMPHETAMINE-DEXTROAMPHET ER 20 MG PO CP24
20.0000 mg | ORAL_CAPSULE | Freq: Every morning | ORAL | 0 refills | Status: AC
  Filled 2022-10-24: qty 30, 30d supply, fill #0

## 2022-08-13 ENCOUNTER — Other Ambulatory Visit (HOSPITAL_COMMUNITY): Payer: Self-pay

## 2022-08-20 ENCOUNTER — Other Ambulatory Visit (HOSPITAL_COMMUNITY): Payer: Self-pay

## 2022-09-03 ENCOUNTER — Other Ambulatory Visit (HOSPITAL_COMMUNITY): Payer: Self-pay

## 2022-09-03 MED ORDER — LEVONORGESTREL-ETHINYL ESTRAD 0.15-30 MG-MCG PO TABS
1.0000 | ORAL_TABLET | Freq: Every day | ORAL | 0 refills | Status: AC
  Filled 2022-09-03: qty 84, 84d supply, fill #0

## 2022-09-04 ENCOUNTER — Other Ambulatory Visit: Payer: Self-pay

## 2022-09-04 ENCOUNTER — Other Ambulatory Visit (HOSPITAL_COMMUNITY): Payer: Self-pay

## 2022-10-15 ENCOUNTER — Other Ambulatory Visit (HOSPITAL_COMMUNITY): Payer: Self-pay

## 2022-10-16 ENCOUNTER — Other Ambulatory Visit: Payer: Self-pay

## 2022-10-17 ENCOUNTER — Other Ambulatory Visit (HOSPITAL_COMMUNITY): Payer: Self-pay

## 2022-10-18 ENCOUNTER — Other Ambulatory Visit (HOSPITAL_COMMUNITY): Payer: Self-pay

## 2022-10-24 ENCOUNTER — Other Ambulatory Visit (HOSPITAL_COMMUNITY): Payer: Self-pay

## 2022-10-31 ENCOUNTER — Other Ambulatory Visit (HOSPITAL_COMMUNITY): Payer: Self-pay

## 2022-10-31 DIAGNOSIS — N944 Primary dysmenorrhea: Secondary | ICD-10-CM | POA: Diagnosis not present

## 2022-10-31 DIAGNOSIS — Z1389 Encounter for screening for other disorder: Secondary | ICD-10-CM | POA: Diagnosis not present

## 2022-10-31 DIAGNOSIS — Z01419 Encounter for gynecological examination (general) (routine) without abnormal findings: Secondary | ICD-10-CM | POA: Diagnosis not present

## 2022-10-31 DIAGNOSIS — Z3040 Encounter for surveillance of contraceptives, unspecified: Secondary | ICD-10-CM | POA: Diagnosis not present

## 2022-11-01 ENCOUNTER — Other Ambulatory Visit (HOSPITAL_COMMUNITY): Payer: Self-pay

## 2022-11-01 MED ORDER — LEVONORGESTREL-ETHINYL ESTRAD 0.15-30 MG-MCG PO TABS
1.0000 | ORAL_TABLET | Freq: Every day | ORAL | 4 refills | Status: DC
  Filled 2022-11-01 – 2022-11-11 (×2): qty 112, 84d supply, fill #0
  Filled 2023-01-27: qty 112, 84d supply, fill #1
  Filled 2023-01-29: qty 56, 42d supply, fill #2
  Filled 2023-07-07: qty 56, 42d supply, fill #3
  Filled 2023-08-04: qty 56, 42d supply, fill #4
  Filled 2023-08-07: qty 112, 84d supply, fill #0
  Filled ????-??-??: fill #4

## 2022-11-01 MED ORDER — LEVONORGESTREL-ETHINYL ESTRAD 0.15-30 MG-MCG PO TABS: 1.0000 | ORAL_TABLET | Freq: Every day | ORAL | 4 refills | Status: AC

## 2022-11-07 ENCOUNTER — Other Ambulatory Visit (HOSPITAL_COMMUNITY): Payer: Self-pay

## 2022-11-07 DIAGNOSIS — F321 Major depressive disorder, single episode, moderate: Secondary | ICD-10-CM | POA: Diagnosis not present

## 2022-11-07 DIAGNOSIS — F902 Attention-deficit hyperactivity disorder, combined type: Secondary | ICD-10-CM | POA: Diagnosis not present

## 2022-11-07 DIAGNOSIS — F411 Generalized anxiety disorder: Secondary | ICD-10-CM | POA: Diagnosis not present

## 2022-11-07 MED ORDER — AMPHETAMINE-DEXTROAMPHET ER 25 MG PO CP24
25.0000 mg | ORAL_CAPSULE | Freq: Every morning | ORAL | 0 refills | Status: AC
  Filled 2022-11-07: qty 30, 30d supply, fill #0

## 2022-11-07 MED ORDER — PROPRANOLOL HCL 10 MG PO TABS
5.0000 mg | ORAL_TABLET | Freq: Every day | ORAL | 0 refills | Status: AC | PRN
  Filled 2022-11-07 – 2023-01-29 (×3): qty 90, 90d supply, fill #0

## 2022-11-07 MED ORDER — AMPHETAMINE-DEXTROAMPHET ER 25 MG PO CP24: 25.0000 mg | ORAL_CAPSULE | Freq: Every morning | ORAL | 0 refills | Status: AC

## 2022-11-07 MED ORDER — DESVENLAFAXINE SUCCINATE ER 100 MG PO TB24
100.0000 mg | ORAL_TABLET | Freq: Every day | ORAL | 0 refills | Status: DC
  Filled 2022-11-07 – 2023-01-20 (×2): qty 90, 90d supply, fill #0

## 2022-11-11 ENCOUNTER — Other Ambulatory Visit (HOSPITAL_COMMUNITY): Payer: Self-pay

## 2022-11-19 ENCOUNTER — Other Ambulatory Visit (HOSPITAL_COMMUNITY): Payer: Self-pay

## 2022-12-03 ENCOUNTER — Other Ambulatory Visit (HOSPITAL_COMMUNITY): Payer: Self-pay

## 2022-12-04 ENCOUNTER — Other Ambulatory Visit (HOSPITAL_COMMUNITY): Payer: Self-pay

## 2022-12-04 MED ORDER — HYDROXYCHLOROQUINE SULFATE 200 MG PO TABS
200.0000 mg | ORAL_TABLET | Freq: Every day | ORAL | 1 refills | Status: DC
  Filled 2022-12-04: qty 30, 30d supply, fill #0
  Filled 2023-01-20: qty 30, 30d supply, fill #1

## 2022-12-06 ENCOUNTER — Other Ambulatory Visit (HOSPITAL_COMMUNITY): Payer: Self-pay

## 2022-12-18 ENCOUNTER — Other Ambulatory Visit (HOSPITAL_COMMUNITY): Payer: Self-pay

## 2023-01-20 ENCOUNTER — Other Ambulatory Visit (HOSPITAL_COMMUNITY): Payer: Self-pay

## 2023-01-23 ENCOUNTER — Other Ambulatory Visit (HOSPITAL_COMMUNITY): Payer: Self-pay

## 2023-01-28 ENCOUNTER — Other Ambulatory Visit (HOSPITAL_COMMUNITY): Payer: Self-pay

## 2023-01-28 DIAGNOSIS — F321 Major depressive disorder, single episode, moderate: Secondary | ICD-10-CM | POA: Diagnosis not present

## 2023-01-28 DIAGNOSIS — F411 Generalized anxiety disorder: Secondary | ICD-10-CM | POA: Diagnosis not present

## 2023-01-28 DIAGNOSIS — F902 Attention-deficit hyperactivity disorder, combined type: Secondary | ICD-10-CM | POA: Diagnosis not present

## 2023-01-28 MED ORDER — PROPRANOLOL HCL 10 MG PO TABS
5.0000 mg | ORAL_TABLET | Freq: Every day | ORAL | 0 refills | Status: AC
  Filled 2023-01-28 – 2023-01-31 (×2): qty 30, 30d supply, fill #0

## 2023-01-28 MED ORDER — DESVENLAFAXINE SUCCINATE ER 100 MG PO TB24
100.0000 mg | ORAL_TABLET | Freq: Every day | ORAL | 0 refills | Status: AC
  Filled 2023-01-28 – 2023-01-29 (×2): qty 120, 120d supply, fill #0
  Filled 2023-01-31: qty 60, 60d supply, fill #0
  Filled 2023-08-04: qty 60, 60d supply, fill #1

## 2023-01-28 MED ORDER — AMPHETAMINE-DEXTROAMPHET ER 25 MG PO CP24
25.0000 mg | ORAL_CAPSULE | Freq: Every morning | ORAL | 0 refills | Status: AC
  Filled 2023-01-28: qty 30, 30d supply, fill #0

## 2023-01-29 ENCOUNTER — Other Ambulatory Visit (HOSPITAL_COMMUNITY): Payer: Self-pay

## 2023-01-29 DIAGNOSIS — R768 Other specified abnormal immunological findings in serum: Secondary | ICD-10-CM | POA: Diagnosis not present

## 2023-01-29 DIAGNOSIS — Z6829 Body mass index (BMI) 29.0-29.9, adult: Secondary | ICD-10-CM | POA: Diagnosis not present

## 2023-01-29 DIAGNOSIS — M0579 Rheumatoid arthritis with rheumatoid factor of multiple sites without organ or systems involvement: Secondary | ICD-10-CM | POA: Diagnosis not present

## 2023-01-29 DIAGNOSIS — E663 Overweight: Secondary | ICD-10-CM | POA: Diagnosis not present

## 2023-01-29 DIAGNOSIS — Z79899 Other long term (current) drug therapy: Secondary | ICD-10-CM | POA: Diagnosis not present

## 2023-01-29 MED ORDER — HYDROXYCHLOROQUINE SULFATE 200 MG PO TABS
200.0000 mg | ORAL_TABLET | Freq: Two times a day (BID) | ORAL | 1 refills | Status: AC
  Filled 2023-01-29: qty 180, 90d supply, fill #0
  Filled 2023-01-31: qty 240, 120d supply, fill #0

## 2023-01-30 ENCOUNTER — Other Ambulatory Visit (HOSPITAL_COMMUNITY): Payer: Self-pay

## 2023-01-31 ENCOUNTER — Other Ambulatory Visit (HOSPITAL_COMMUNITY): Payer: Self-pay

## 2023-01-31 MED ORDER — AMPHETAMINE-DEXTROAMPHET ER 25 MG PO CP24
25.0000 mg | ORAL_CAPSULE | Freq: Every morning | ORAL | 0 refills | Status: AC
  Filled 2023-01-31: qty 120, 120d supply, fill #0

## 2023-01-31 MED ORDER — AMPHETAMINE-DEXTROAMPHET ER 25 MG PO CP24: 25.0000 mg | ORAL_CAPSULE | Freq: Every morning | ORAL | 0 refills | Status: AC

## 2023-02-03 ENCOUNTER — Other Ambulatory Visit (HOSPITAL_COMMUNITY): Payer: Self-pay

## 2023-02-03 MED ORDER — AMPHETAMINE-DEXTROAMPHET ER 25 MG PO CP24: ORAL_CAPSULE | ORAL | 0 refills | Status: AC

## 2023-02-18 ENCOUNTER — Other Ambulatory Visit (HOSPITAL_BASED_OUTPATIENT_CLINIC_OR_DEPARTMENT_OTHER): Payer: Self-pay

## 2023-02-18 MED ORDER — DOXYCYCLINE HYCLATE 100 MG PO TABS
100.0000 mg | ORAL_TABLET | Freq: Every day | ORAL | 1 refills | Status: DC
  Filled 2023-02-18: qty 90, 90d supply, fill #0
  Filled 2023-07-01: qty 90, 90d supply, fill #1

## 2023-06-27 ENCOUNTER — Other Ambulatory Visit: Payer: Self-pay

## 2023-06-27 ENCOUNTER — Other Ambulatory Visit (HOSPITAL_COMMUNITY): Payer: Self-pay

## 2023-06-27 DIAGNOSIS — F902 Attention-deficit hyperactivity disorder, combined type: Secondary | ICD-10-CM | POA: Diagnosis not present

## 2023-06-27 DIAGNOSIS — F321 Major depressive disorder, single episode, moderate: Secondary | ICD-10-CM | POA: Diagnosis not present

## 2023-06-27 DIAGNOSIS — F411 Generalized anxiety disorder: Secondary | ICD-10-CM | POA: Diagnosis not present

## 2023-06-27 MED ORDER — DESVENLAFAXINE SUCCINATE ER 100 MG PO TB24
100.0000 mg | ORAL_TABLET | Freq: Every day | ORAL | 0 refills | Status: AC
  Filled 2023-06-27: qty 90, 90d supply, fill #0

## 2023-06-27 MED ORDER — AMPHETAMINE-DEXTROAMPHET ER 25 MG PO CP24: 25.0000 mg | ORAL_CAPSULE | ORAL | 0 refills | Status: AC

## 2023-06-27 MED ORDER — AMPHETAMINE-DEXTROAMPHET ER 25 MG PO CP24
25.0000 mg | ORAL_CAPSULE | ORAL | 0 refills | Status: AC
  Filled 2023-06-27: qty 30, 30d supply, fill #0

## 2023-06-27 MED ORDER — PROPRANOLOL HCL 10 MG PO TABS
5.0000 mg | ORAL_TABLET | Freq: Every day | ORAL | 0 refills | Status: AC | PRN
  Filled 2023-06-27: qty 90, 90d supply, fill #0

## 2023-07-01 ENCOUNTER — Other Ambulatory Visit (HOSPITAL_COMMUNITY): Payer: Self-pay

## 2023-07-07 ENCOUNTER — Other Ambulatory Visit (HOSPITAL_COMMUNITY): Payer: Self-pay

## 2023-07-30 ENCOUNTER — Other Ambulatory Visit (HOSPITAL_COMMUNITY): Payer: Self-pay

## 2023-07-30 DIAGNOSIS — M0579 Rheumatoid arthritis with rheumatoid factor of multiple sites without organ or systems involvement: Secondary | ICD-10-CM | POA: Diagnosis not present

## 2023-07-30 DIAGNOSIS — R768 Other specified abnormal immunological findings in serum: Secondary | ICD-10-CM | POA: Diagnosis not present

## 2023-07-30 DIAGNOSIS — Z6828 Body mass index (BMI) 28.0-28.9, adult: Secondary | ICD-10-CM | POA: Diagnosis not present

## 2023-07-30 DIAGNOSIS — Z79899 Other long term (current) drug therapy: Secondary | ICD-10-CM | POA: Diagnosis not present

## 2023-07-30 DIAGNOSIS — E663 Overweight: Secondary | ICD-10-CM | POA: Diagnosis not present

## 2023-07-30 MED ORDER — HYDROXYCHLOROQUINE SULFATE 200 MG PO TABS
200.0000 mg | ORAL_TABLET | Freq: Every day | ORAL | 3 refills | Status: AC
  Filled 2023-07-30: qty 90, 90d supply, fill #0
  Filled 2023-11-29: qty 90, 90d supply, fill #1

## 2023-08-05 ENCOUNTER — Other Ambulatory Visit (HOSPITAL_COMMUNITY): Payer: Self-pay

## 2023-08-06 ENCOUNTER — Other Ambulatory Visit (HOSPITAL_COMMUNITY): Payer: Self-pay

## 2023-08-07 ENCOUNTER — Other Ambulatory Visit (HOSPITAL_COMMUNITY): Payer: Self-pay

## 2023-08-07 ENCOUNTER — Encounter (HOSPITAL_COMMUNITY): Payer: Self-pay

## 2023-08-18 ENCOUNTER — Other Ambulatory Visit (HOSPITAL_COMMUNITY): Payer: Self-pay

## 2023-10-02 ENCOUNTER — Other Ambulatory Visit (HOSPITAL_COMMUNITY): Payer: Self-pay

## 2023-10-02 MED ORDER — DESVENLAFAXINE SUCCINATE ER 100 MG PO TB24
100.0000 mg | ORAL_TABLET | Freq: Every day | ORAL | 0 refills | Status: AC
  Filled 2023-10-02: qty 14, 14d supply, fill #0

## 2023-10-03 ENCOUNTER — Other Ambulatory Visit (HOSPITAL_COMMUNITY): Payer: Self-pay

## 2023-10-14 ENCOUNTER — Other Ambulatory Visit (HOSPITAL_COMMUNITY): Payer: Self-pay

## 2023-10-14 DIAGNOSIS — F902 Attention-deficit hyperactivity disorder, combined type: Secondary | ICD-10-CM | POA: Diagnosis not present

## 2023-10-14 DIAGNOSIS — F321 Major depressive disorder, single episode, moderate: Secondary | ICD-10-CM | POA: Diagnosis not present

## 2023-10-14 DIAGNOSIS — F411 Generalized anxiety disorder: Secondary | ICD-10-CM | POA: Diagnosis not present

## 2023-10-14 MED ORDER — AMPHETAMINE-DEXTROAMPHET ER 25 MG PO CP24: 25.0000 mg | ORAL_CAPSULE | Freq: Every morning | ORAL | 0 refills | Status: AC

## 2023-10-14 MED ORDER — AMPHETAMINE-DEXTROAMPHET ER 25 MG PO CP24
25.0000 mg | ORAL_CAPSULE | Freq: Every morning | ORAL | 0 refills | Status: AC
  Filled 2023-11-29: qty 30, 30d supply, fill #0

## 2023-10-14 MED ORDER — PROPRANOLOL HCL 10 MG PO TABS
5.0000 mg | ORAL_TABLET | Freq: Every day | ORAL | 0 refills | Status: AC | PRN
  Filled 2023-10-14: qty 90, 90d supply, fill #0

## 2023-10-14 MED ORDER — DESVENLAFAXINE SUCCINATE ER 100 MG PO TB24
100.0000 mg | ORAL_TABLET | Freq: Every day | ORAL | 0 refills | Status: DC
  Filled 2023-10-14: qty 90, 90d supply, fill #0

## 2023-10-14 MED ORDER — AMPHETAMINE-DEXTROAMPHET ER 25 MG PO CP24
25.0000 mg | ORAL_CAPSULE | Freq: Every morning | ORAL | 0 refills | Status: AC
  Filled 2023-10-14: qty 30, 30d supply, fill #0

## 2023-10-20 ENCOUNTER — Other Ambulatory Visit (HOSPITAL_COMMUNITY): Payer: Self-pay

## 2023-10-28 ENCOUNTER — Other Ambulatory Visit (HOSPITAL_COMMUNITY): Payer: Self-pay

## 2023-11-29 ENCOUNTER — Other Ambulatory Visit (HOSPITAL_COMMUNITY): Payer: Self-pay

## 2023-12-01 ENCOUNTER — Other Ambulatory Visit: Payer: Self-pay

## 2023-12-01 ENCOUNTER — Other Ambulatory Visit (HOSPITAL_COMMUNITY): Payer: Self-pay

## 2023-12-01 MED ORDER — LEVONORGESTREL-ETHINYL ESTRAD 0.15-30 MG-MCG PO TABS
1.0000 | ORAL_TABLET | Freq: Every day | ORAL | 0 refills | Status: AC
  Filled 2023-12-01: qty 112, 84d supply, fill #0

## 2024-01-21 ENCOUNTER — Other Ambulatory Visit (HOSPITAL_COMMUNITY): Payer: Self-pay

## 2024-01-27 ENCOUNTER — Other Ambulatory Visit (HOSPITAL_COMMUNITY): Payer: Self-pay

## 2024-01-27 ENCOUNTER — Encounter (HOSPITAL_COMMUNITY): Payer: Self-pay

## 2024-01-27 MED ORDER — DESVENLAFAXINE SUCCINATE ER 100 MG PO TB24
100.0000 mg | ORAL_TABLET | Freq: Every day | ORAL | 0 refills | Status: AC
  Filled 2024-01-27: qty 90, 90d supply, fill #0

## 2024-01-27 MED ORDER — PROPRANOLOL HCL 10 MG PO TABS
5.0000 mg | ORAL_TABLET | Freq: Every day | ORAL | 0 refills | Status: AC | PRN
  Filled 2024-01-27: qty 90, 90d supply, fill #0

## 2024-01-27 MED ORDER — AMPHETAMINE-DEXTROAMPHET ER 25 MG PO CP24: 25.0000 mg | ORAL_CAPSULE | Freq: Every morning | ORAL | 0 refills | Status: AC

## 2024-01-27 MED ORDER — AMPHETAMINE-DEXTROAMPHET ER 25 MG PO CP24
25.0000 mg | ORAL_CAPSULE | Freq: Every morning | ORAL | 0 refills | Status: AC
  Filled 2024-01-27: qty 30, 30d supply, fill #0

## 2024-01-29 ENCOUNTER — Other Ambulatory Visit (HOSPITAL_COMMUNITY): Payer: Self-pay

## 2024-02-27 ENCOUNTER — Other Ambulatory Visit (HOSPITAL_COMMUNITY): Payer: Self-pay

## 2024-02-27 ENCOUNTER — Other Ambulatory Visit (HOSPITAL_BASED_OUTPATIENT_CLINIC_OR_DEPARTMENT_OTHER): Payer: Self-pay

## 2024-02-27 ENCOUNTER — Encounter (HOSPITAL_COMMUNITY): Payer: Self-pay

## 2024-02-27 MED ORDER — DOXYCYCLINE HYCLATE 100 MG PO TABS
100.0000 mg | ORAL_TABLET | Freq: Every day | ORAL | 3 refills | Status: AC
  Filled 2024-02-27 (×2): qty 90, 90d supply, fill #0
